# Patient Record
Sex: Male | Born: 1965 | Race: White | Hispanic: No | Marital: Married | Smoking: Never smoker
Health system: Southern US, Community
[De-identification: ages and names within clinical notes are randomized; demographics above are authoritative.]

## PROBLEM LIST (undated history)

## (undated) DIAGNOSIS — T7840XA Allergy, unspecified, initial encounter: Secondary | ICD-10-CM

## (undated) DIAGNOSIS — R000 Tachycardia, unspecified: Secondary | ICD-10-CM

## (undated) DIAGNOSIS — J309 Allergic rhinitis, unspecified: Secondary | ICD-10-CM

## (undated) DIAGNOSIS — M1990 Unspecified osteoarthritis, unspecified site: Secondary | ICD-10-CM

## (undated) DIAGNOSIS — L509 Urticaria, unspecified: Secondary | ICD-10-CM

## (undated) HISTORY — DX: Tachycardia, unspecified: R00.0

## (undated) HISTORY — DX: Urticaria, unspecified: L50.9

## (undated) HISTORY — DX: Allergy, unspecified, initial encounter: T78.40XA

## (undated) HISTORY — PX: NO PAST SURGERIES: SHX2092

## (undated) HISTORY — DX: Allergic rhinitis, unspecified: J30.9

## (undated) HISTORY — DX: Unspecified osteoarthritis, unspecified site: M19.90

---

## 2014-04-08 ENCOUNTER — Emergency Department (HOSPITAL_COMMUNITY): Payer: BC Managed Care – PPO

## 2014-04-08 ENCOUNTER — Emergency Department (HOSPITAL_COMMUNITY)
Admission: EM | Admit: 2014-04-08 | Discharge: 2014-04-08 | Disposition: A | Discharge status: Home / Self Care | Payer: BC Managed Care – PPO | Attending: Emergency Medicine | Admitting: Emergency Medicine

## 2014-04-08 ENCOUNTER — Encounter (HOSPITAL_COMMUNITY): Payer: Self-pay | Admitting: Emergency Medicine

## 2014-04-08 DIAGNOSIS — Z23 Encounter for immunization: Secondary | ICD-10-CM | POA: Insufficient documentation

## 2014-04-08 DIAGNOSIS — Y9389 Activity, other specified: Secondary | ICD-10-CM | POA: Diagnosis not present

## 2014-04-08 DIAGNOSIS — Z880 Allergy status to penicillin: Secondary | ICD-10-CM | POA: Diagnosis not present

## 2014-04-08 DIAGNOSIS — M25512 Pain in left shoulder: Secondary | ICD-10-CM

## 2014-04-08 DIAGNOSIS — IMO0002 Reserved for concepts with insufficient information to code with codable children: Secondary | ICD-10-CM | POA: Insufficient documentation

## 2014-04-08 DIAGNOSIS — S4980XA Other specified injuries of shoulder and upper arm, unspecified arm, initial encounter: Secondary | ICD-10-CM | POA: Diagnosis present

## 2014-04-08 DIAGNOSIS — S46909A Unspecified injury of unspecified muscle, fascia and tendon at shoulder and upper arm level, unspecified arm, initial encounter: Secondary | ICD-10-CM | POA: Diagnosis present

## 2014-04-08 DIAGNOSIS — T148XXA Other injury of unspecified body region, initial encounter: Secondary | ICD-10-CM

## 2014-04-08 DIAGNOSIS — Y92410 Unspecified street and highway as the place of occurrence of the external cause: Secondary | ICD-10-CM | POA: Insufficient documentation

## 2014-04-08 LAB — BASIC METABOLIC PANEL
Anion gap: 14 (ref 5–15)
BUN: 18 mg/dL (ref 6–23)
CALCIUM: 9 mg/dL (ref 8.4–10.5)
CHLORIDE: 104 meq/L (ref 96–112)
CO2: 25 mEq/L (ref 19–32)
CREATININE: 1.19 mg/dL (ref 0.50–1.35)
GFR, EST AFRICAN AMERICAN: 84 mL/min — AB (ref >90)
GFR, EST NON AFRICAN AMERICAN: 72 mL/min — AB (ref >90)
Glucose, Bld: 93 mg/dL (ref 70–99)
Potassium: 3.9 mEq/L (ref 3.7–5.3)
Sodium: 143 mEq/L (ref 137–147)

## 2014-04-08 LAB — CBC WITH DIFFERENTIAL/PLATELET
BASOS ABS: 0 10*3/uL (ref 0.0–0.1)
BASOS PCT: 0 % (ref 0–1)
EOS ABS: 0 10*3/uL (ref 0.0–0.7)
Eosinophils Relative: 0 % (ref 0–5)
HEMATOCRIT: 41.5 % (ref 39.0–52.0)
HEMOGLOBIN: 14.8 g/dL (ref 13.0–17.0)
Lymphocytes Relative: 8 % — ABNORMAL LOW (ref 12–46)
Lymphs Abs: 0.8 10*3/uL (ref 0.7–4.0)
MCH: 30.8 pg (ref 26.0–34.0)
MCHC: 35.7 g/dL (ref 30.0–36.0)
MCV: 86.3 fL (ref 78.0–100.0)
MONO ABS: 0.6 10*3/uL (ref 0.1–1.0)
Monocytes Relative: 6 % (ref 3–12)
Neutro Abs: 8.6 10*3/uL — ABNORMAL HIGH (ref 1.7–7.7)
Neutrophils Relative %: 86 % — ABNORMAL HIGH (ref 43–77)
Platelets: 225 10*3/uL (ref 150–400)
RBC: 4.81 MIL/uL (ref 4.22–5.81)
RDW: 12.7 % (ref 11.5–15.5)
WBC: 10 10*3/uL (ref 4.0–10.5)

## 2014-04-08 MED ORDER — MORPHINE SULFATE 4 MG/ML IJ SOLN
4.0000 mg | Freq: Once | INTRAMUSCULAR | Status: AC
  Administered 2014-04-08: 4 mg via INTRAVENOUS
  Filled 2014-04-08: qty 1

## 2014-04-08 MED ORDER — HYDROCODONE-ACETAMINOPHEN 5-325 MG PO TABS: 1.0000 | ORAL_TABLET | Freq: Four times a day (QID) | ORAL | Status: DC | PRN

## 2014-04-08 MED ORDER — TETANUS-DIPHTH-ACELL PERTUSSIS 5-2.5-18.5 LF-MCG/0.5 IM SUSP
0.5000 mL | Freq: Once | INTRAMUSCULAR | Status: AC
Start: 2014-04-08 — End: 2014-04-08
  Administered 2014-04-08: 0.5 mL via INTRAMUSCULAR
  Filled 2014-04-08: qty 0.5
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
## 2014-04-08 NOTE — Discharge Instructions (Signed)
Abrasion An abrasion is a cut or scrape of the skin. Abrasions do not extend through all layers of the skin and most heal within 10 days. It is important to care for your abrasion properly to prevent infection. CAUSES  Most abrasions are caused by falling on, or gliding across, the ground or other surface. When your skin rubs on something, the outer and inner layer of skin rubs off, causing an abrasion. DIAGNOSIS  Your caregiver will be able to diagnose an abrasion during a physical exam.  TREATMENT  Your treatment depends on how large and deep the abrasion is. Generally, your abrasion will be cleaned with water and a mild soap to remove any dirt or debris. An antibiotic ointment may be put over the abrasion to prevent an infection. A bandage (dressing) may be wrapped around the abrasion to keep it from getting dirty.  You may need a tetanus shot if:  You cannot remember when you had your last tetanus shot.  You have never had a tetanus shot.  The injury broke your skin. If you get a tetanus shot, your arm may swell, get red, and feel warm to the touch. This is common and not a problem. If you need a tetanus shot and you choose not to have one, there is a rare chance of getting tetanus. Sickness from tetanus can be serious.  HOME CARE INSTRUCTIONS   If a dressing was applied, change it at least once a day or as directed by your caregiver. If the bandage sticks, soak it off with warm water.   Wash the area with water and a mild soap to remove all the ointment 2 times a day. Rinse off the soap and pat the area dry with a clean towel.   Reapply any ointment as directed by your caregiver. This will help prevent infection and keep the bandage from sticking. Use gauze over the wound and under the dressing to help keep the bandage from sticking.   Change your dressing right away if it becomes wet or dirty.   Only take over-the-counter or prescription medicines for pain, discomfort, or fever as  directed by your caregiver.   Follow up with your caregiver within 24-48 hours for a wound check, or as directed. If you were not given a wound-check appointment, look closely at your abrasion for redness, swelling, or pus. These are signs of infection. SEEK IMMEDIATE MEDICAL CARE IF:   You have increasing pain in the wound.   You have redness, swelling, or tenderness around the wound.   You have pus coming from the wound.   You have a fever or persistent symptoms for more than 2-3 days.  You have a fever and your symptoms suddenly get worse.  You have a bad smell coming from the wound or dressing.  MAKE SURE YOU:   Understand these instructions.  Will watch your condition.  Will get help right away if you are not doing well or get worse. Document Released: 04/05/2005 Document Revised: 06/12/2012 Document Reviewed: 05/30/2011 [facility] Patient Information 2015 [place], [place]. This information is not intended to replace advice given to you by your health care provider. Make sure you discuss any questions you have with your health care provider. Shoulder Separation You have a shoulder separation (acromioclavicular separation). This occurs when an injury stretches or tears the ligaments that connect the top of the shoulder blade to the collar bone. Injury causes these bones to separate. A sling or splint may be applied to limit  your range of motion. HOME CARE INSTRUCTIONS   Apply ice to the injury for 15-20 minutes each hour while awake for the first 2 days. Put the ice in a plastic bag. Place a towel between the bag of ice and your skin.  Avoid activities that increase pain.  Wear your sling or splint for as long as directed by your caregiver. If you remove the sling to dress or bathe, be sure your arm remains in the same position as when the sling is on. Do not lift your arm.  The splint must be tightened by another person every day. Tighten it enough to keep the shoulders held  back. Allow enough room to place the index finger between the body and strap. Loosen the splint immediately if you feel numbness or tingling in your hands.  Only take over-the-counter or prescription medicines for pain, discomfort, or fever as directed by your caregiver.  If this is a severe injury, you may need a referral to a specialist. It is very important to keep any follow-up appointments in order to avoid any type of permanent shoulder disability or chronic pain problems. SEEK MEDICAL CARE IF:  Pain or swelling to the injury site increases and is not relieved with medications. SEEK IMMEDIATE MEDICAL CARE IF:  Your arm becomes numb, pale, cold, or there are abnormal feelings (sensations) shooting into your fingertips. Document Released: 04/05/2005 Document Revised: 11/10/2013 Document Reviewed: 02/05/2007 [facility] Patient Information 2015 [place], [place]. This information is not intended to replace advice given to you by your health care provider. Make sure you discuss any questions you have with your health care provider.

## 2014-04-08 NOTE — ED Notes (Signed)
Family at bedside. 
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
## 2014-04-13 ENCOUNTER — Encounter: Payer: Self-pay | Admitting: Family Medicine

## 2014-04-13 ENCOUNTER — Ambulatory Visit (INDEPENDENT_AMBULATORY_CARE_PROVIDER_SITE_OTHER): Payer: BC Managed Care – PPO | Admitting: Family Medicine

## 2014-04-13 VITALS — BP 113/74 | HR 77 | Temp 98.6°F | Resp 16 | Ht 68.0 in | Wt 174.0 lb

## 2014-04-13 DIAGNOSIS — L089 Local infection of the skin and subcutaneous tissue, unspecified: Secondary | ICD-10-CM

## 2014-04-13 DIAGNOSIS — S40812A Abrasion of left upper arm, initial encounter: Secondary | ICD-10-CM

## 2014-04-13 MED ORDER — SULFAMETHOXAZOLE-TMP DS 800-160 MG PO TABS: ORAL_TABLET | ORAL | Status: DC

## 2014-04-13 MED ORDER — SILVER SULFADIAZINE 1 % EX CREA: 1.0000 "application " | TOPICAL_CREAM | Freq: Every day | CUTANEOUS | Status: DC
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
## 2014-04-13 NOTE — Progress Notes (Signed)
Pre visit review using our clinic review tool, if applicable. No additional management support is needed unless otherwise documented below in the visit note. 

## 2014-04-19 ENCOUNTER — Encounter: Payer: Self-pay | Admitting: Family Medicine

## 2014-04-20 ENCOUNTER — Encounter: Payer: Self-pay | Admitting: Family Medicine

## 2014-04-20 ENCOUNTER — Ambulatory Visit (INDEPENDENT_AMBULATORY_CARE_PROVIDER_SITE_OTHER): Payer: BC Managed Care – PPO | Admitting: Family Medicine

## 2014-04-20 VITALS — BP 125/74 | HR 78 | Temp 98.3°F | Resp 18 | Ht 68.0 in | Wt 176.0 lb

## 2014-04-20 DIAGNOSIS — Z8042 Family history of malignant neoplasm of prostate: Secondary | ICD-10-CM | POA: Insufficient documentation

## 2014-04-20 DIAGNOSIS — L089 Local infection of the skin and subcutaneous tissue, unspecified: Secondary | ICD-10-CM | POA: Insufficient documentation

## 2014-04-20 DIAGNOSIS — S40812A Abrasion of left upper arm, initial encounter: Secondary | ICD-10-CM

## 2014-04-20 DIAGNOSIS — S40812D Abrasion of left upper arm, subsequent encounter: Secondary | ICD-10-CM

## 2014-04-20 DIAGNOSIS — L509 Urticaria, unspecified: Secondary | ICD-10-CM

## 2014-04-20 NOTE — Progress Notes (Signed)
Pre visit review using our clinic review tool, if applicable. No additional management support is needed unless otherwise documented below in the visit note. 

## 2014-04-20 NOTE — Progress Notes (Signed)
OFFICE NOTE  04/20/2014  CC:  Chief Complaint  Patient presents with  . Follow-up    arm abrasion     HPI: Patient is a 48 y.o. Caucasian male who is here for 1 week f/u left arm infected abrasion. Arm is much improved.  No fever, malaise, body aches.  Tolerating antibiotics fine. Not requiring pain meds.  Pt reports longstanding history of "random itching" on focal areas of his skin---often a hand or leg.  He scratches the itch and it will swell, although he denies seeing any red rash or hives in the area.  He does not take any meds when it happens, says he never gets any SOB/wheezing, swelling of lips, tongue, or eyes. He most recently had the itching on left side of chin and this was the first time he has had it on his face: his wife showed me a pic of this on her cell phone today.  His skin/soft tissue of left lower jaw region appears swollen, but I see no erythema, rash, or swelling of the lip or eyes.  Pt cannot detect any trigger to these itching/swelling episodes.  Pertinent PMH:  Past medical, surgical, social, and family history reviewed and no changes are noted since last office visit.  MEDS: Not taking vicodin listed below Outpatient Prescriptions Prior to Visit  Medication Sig Dispense Refill  . ibuprofen (ADVIL,MOTRIN) 200 MG tablet Take 200 mg by mouth every 6 (six) hours as needed.      . silver sulfADIAZINE (SILVADENE) 1 % cream Apply 1 application topically daily.  50 g  1  . sulfamethoxazole-trimethoprim (BACTRIM DS) 800-160 MG per tablet 2 tabs po bid x 3d, then 1 tab po bid x 7d  26 tablet  0  . HYDROcodone-acetaminophen (NORCO/VICODIN) 5-325 MG per tablet Take 1 tablet by mouth every 6 (six) hours as needed.  12 tablet  0   No facility-administered medications prior to visit.    PE: Blood pressure 125/74, pulse 78, temperature 98.3 F (36.8 C), temperature source Temporal, resp. rate 18, height 5\' 8"  (1.727 m), weight 176 lb (79.833 kg), SpO2 97.00%. Gen:  Alert, well appearing.  Patient is oriented to person, place, time, and situation. Face: no swelling.   Left upper arm with large patch of pink skin that is dry and without any weeping areas or erythema/exudate. No tenderness.  No streaking or surrounding erythema or induration.  IMPRESSION AND PLAN:  1) Left upper arm abrasion, healing appropriately with bactrim and silvadene. May d/c silvadene. Finish bactrim.  2) Focal skin itching/swelling, unknown cause. Refer to allergist for further evaluation.  An After Visit Summary was printed and given to the patient.  FOLLOW UP: prn

## 2014-04-23 ENCOUNTER — Encounter: Payer: Self-pay | Admitting: Family Medicine

## 2014-05-14 ENCOUNTER — Encounter: Payer: Self-pay | Admitting: Family Medicine
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
## 2016-05-25 ENCOUNTER — Encounter: Payer: Self-pay | Admitting: Physician Assistant

## 2016-05-25 ENCOUNTER — Ambulatory Visit (INDEPENDENT_AMBULATORY_CARE_PROVIDER_SITE_OTHER): Payer: BLUE CROSS/BLUE SHIELD | Admitting: Physician Assistant

## 2016-05-25 VITALS — BP 126/80 | HR 78 | Temp 97.3°F | Resp 16 | Ht 68.0 in | Wt 182.4 lb

## 2016-05-25 DIAGNOSIS — Z13220 Encounter for screening for lipoid disorders: Secondary | ICD-10-CM

## 2016-05-25 DIAGNOSIS — Z131 Encounter for screening for diabetes mellitus: Secondary | ICD-10-CM

## 2016-05-25 DIAGNOSIS — G8929 Other chronic pain: Secondary | ICD-10-CM

## 2016-05-25 DIAGNOSIS — Z0000 Encounter for general adult medical examination without abnormal findings: Secondary | ICD-10-CM | POA: Diagnosis not present

## 2016-05-25 DIAGNOSIS — E559 Vitamin D deficiency, unspecified: Secondary | ICD-10-CM

## 2016-05-25 DIAGNOSIS — Z130 Encounter for screening for diseases of the blood and blood-forming organs and certain disorders involving the immune mechanism: Secondary | ICD-10-CM

## 2016-05-25 DIAGNOSIS — N401 Enlarged prostate with lower urinary tract symptoms: Secondary | ICD-10-CM

## 2016-05-25 DIAGNOSIS — Z1389 Encounter for screening for other disorder: Secondary | ICD-10-CM

## 2016-05-25 DIAGNOSIS — Z79899 Other long term (current) drug therapy: Secondary | ICD-10-CM

## 2016-05-25 DIAGNOSIS — R350 Frequency of micturition: Secondary | ICD-10-CM

## 2016-05-25 DIAGNOSIS — Z125 Encounter for screening for malignant neoplasm of prostate: Secondary | ICD-10-CM

## 2016-05-25 DIAGNOSIS — M25562 Pain in left knee: Principal | ICD-10-CM

## 2016-05-25 LAB — LIPID PANEL
CHOL/HDL RATIO: 3.2 ratio (ref <5.0)
CHOLESTEROL: 281 mg/dL — AB (ref <200)
HDL: 89 mg/dL (ref >40)
LDL Cholesterol: 178 mg/dL — ABNORMAL HIGH (ref <100)
Triglycerides: 68 mg/dL (ref <150)
VLDL: 14 mg/dL (ref <30)

## 2016-05-25 LAB — BASIC METABOLIC PANEL WITH GFR
BUN: 19 mg/dL (ref 7–25)
CO2: 22 mmol/L (ref 20–31)
Calcium: 9.8 mg/dL (ref 8.6–10.3)
Chloride: 105 mmol/L (ref 98–110)
Creat: 1.1 mg/dL (ref 0.60–1.35)
GFR, Est Non African American: 79 mL/min (ref >=60)
Glucose, Bld: 92 mg/dL (ref 65–99)
Potassium: 4.4 mmol/L (ref 3.5–5.3)
Sodium: 138 mmol/L (ref 135–146)

## 2016-05-25 LAB — CBC WITH DIFFERENTIAL/PLATELET
Basophils Absolute: 0 cells/uL (ref 0–200)
Basophils Relative: 0 %
EOS ABS: 50 {cells}/uL (ref 15–500)
Eosinophils Relative: 1 %
HCT: 44.8 % (ref 38.5–50.0)
Hemoglobin: 15.7 g/dL (ref 13.2–17.1)
LYMPHS PCT: 21 %
Lymphs Abs: 1050 cells/uL (ref 850–3900)
MCH: 30.4 pg (ref 27.0–33.0)
MCHC: 35 g/dL (ref 32.0–36.0)
MCV: 86.7 fL (ref 80.0–100.0)
MONOS PCT: 9 %
MPV: 9.1 fL (ref 7.5–12.5)
Monocytes Absolute: 450 cells/uL (ref 200–950)
NEUTROS PCT: 69 %
Neutro Abs: 3450 cells/uL (ref 1500–7800)
PLATELETS: 261 10*3/uL (ref 140–400)
RBC: 5.17 MIL/uL (ref 4.20–5.80)
RDW: 13.9 % (ref 11.0–15.0)
WBC: 5 10*3/uL (ref 3.8–10.8)

## 2016-05-25 LAB — HEPATIC FUNCTION PANEL
ALT: 12 U/L (ref 9–46)
AST: 15 U/L (ref 10–40)
Albumin: 4.6 g/dL (ref 3.6–5.1)
Alkaline Phosphatase: 62 U/L (ref 40–115)
BILIRUBIN INDIRECT: 0.7 mg/dL (ref 0.2–1.2)
BILIRUBIN TOTAL: 0.8 mg/dL (ref 0.2–1.2)
Bilirubin, Direct: 0.1 mg/dL (ref <=0.2)
Total Protein: 6.9 g/dL (ref 6.1–8.1)

## 2016-05-25 LAB — TSH: TSH: 2.19 mIU/L (ref 0.40–4.50)

## 2016-05-25 MED ORDER — MELOXICAM 15 MG PO TABS: ORAL_TABLET | ORAL | 1 refills | Status: DC

## 2016-05-25 NOTE — Progress Notes (Signed)
New patient CPE  Assessment and Plan: 1. Chronic pain of left knee Likely patellar tendonitis mobic x 1 month Ice, do not put pressure on it If not better refer ortho/MRI  2. Benign prostatic hyperplasia with urinary frequency - PSA  3. Screening cholesterol level - Lipid panel  4. Screening for blood or protein in urine - Urinalysis, Routine w reflex microscopic (not at [facility]) - Microalbumin / creatinine urine ratio  5. Screening, anemia, deficiency, iron - Iron and TIBC - Ferritin  6. Screening for diabetes mellitus - Hemoglobin A1c  7. Vitamin D deficiency - VITAMIN D 25 Hydroxy (Vit-D Deficiency, Fractures)  8. Medication management - CBC with Differential/Platelet - BASIC METABOLIC PANEL WITH GFR - Hepatic function panel - Magnesium  Discussed med's effects and SE's. Screening labs and tests as requested with regular follow-up as recommended.  HPI Patient presents for a complete physical. Moved here from [place], no PCP.   His blood pressure has been controlled at home, today their BP is BP: 126/80 Last GFR: Lab Results  Component Value Date   GFRNONAA 72 (L) 04/08/2014   Right knee pain, worse with leaning on something or bending down x 6 months. No pain with walking, standing, has some pain with lateral turning. Works for [employer] and is on his knees a lot and has been having issues with it then. No radiation, no swelling, redness, warmth. No tylenol/aleve.   Current Medications:  No current outpatient prescriptions on file prior to visit.   No current facility-administered medications on file prior to visit.    Allergies:  Allergies  Allergen Reactions  . Aspirin Rash  . Penicillins Swelling   Patient Care Team: Unk Pinto, MD as PCP - General (Internal Medicine)  Immunization History  Administered Date(s) Administered  . Tdap 04/08/2014    Medical History:  has Family history of prostate cancer and Abrasion of upper arm, left, infected on  his problem list. Surgical History:  He  has no past surgical history on file. Family History:  His family history includes Arthritis in his mother; Asthma in his brother; Prostate cancer (age of onset: 41) in his father. Social History:   reports that he has never smoked. He has never used smokeless tobacco. He reports that he drinks alcohol. He reports that he does not use drugs.  Review of Systems:  Review of Systems  Constitutional: Negative.   HENT: Negative.   Eyes: Negative.   Respiratory: Negative.   Cardiovascular: Negative.   Gastrointestinal: Negative.   Genitourinary: Negative.        Nocturia x 2-3  Musculoskeletal: Positive for joint pain.  Skin: Negative.   Neurological: Negative.   Endo/Heme/Allergies: Negative.   Psychiatric/Behavioral: Negative.     Physical Exam: Estimated body mass index is 27.73 kg/m as calculated from the following:   Height as of this encounter: 5\' 8"  (1.727 m).   Weight as of this encounter: 182 lb 6.4 oz (82.7 kg). BP 126/80   Pulse 78   Temp 97.3 F (36.3 C)   Resp 16   Ht 5\' 8"  (1.727 m)   Wt 182 lb 6.4 oz (82.7 kg)   SpO2 98%   BMI 27.73 kg/m  General Appearance: Well nourished, in no apparent distress.  Eyes: PERRLA, EOMs, conjunctiva no swelling or erythema, normal fundi and vessels.  Sinuses: No Frontal/maxillary tenderness  ENT/Mouth: Ext aud canals clear, normal light reflex with TMs without erythema, bulging. Good dentition. No erythema, swelling, or exudate on post pharynx.  Tonsils not swollen or erythematous. Hearing normal.  Neck: Supple, thyroid normal. No bruits  Respiratory: Respiratory effort normal, BS equal bilaterally without rales, rhonchi, wheezing or stridor.  Cardio: RRR without murmurs, rubs or gallops. Brisk peripheral pulses without edema.  Chest: symmetric, with normal excursions and percussion.  Abdomen: Soft, nontender, no guarding, rebound, hernias, masses, or organomegaly.  Lymphatics: Non tender  without lymphadenopathy.  GYN: defer Dr. Melford Aase Musculoskeletal: Full ROM all peripheral extremities,5/5 strength, and normal gait. Right knee without swelling, warmth, erythema or effusion. No joint line tenderness, no bursa swelling, no laxity of ACL, MCL/LCL. + tenderness at patellar tendon, no bony tenderensss.  Skin: Warm, dry without rashes, lesions, ecchymosis. Dermafibroma left lateral thigh.  Neuro: Cranial nerves intact, reflexes equal bilaterally. Normal muscle tone, no cerebellar symptoms. Sensation intact.  Psych: Awake and oriented X 3, normal affect, Insight and Judgment appropriate.   Vicie Mutters 11:56 AM [facility]

## 2016-05-25 NOTE — Patient Instructions (Addendum)
Mobic once daily x 1 month Ice at least once daily for 15 mins Avoid pressure If not better will refer ortho/get MRI   Hoffa Disease Hoffa disease is a condition that causes pain in front of the knee, below the kneecap. The pain is caused by pinching (impingement) ofa swollen or scarred mass of fatty tissue (fat pad) below your kneecap. There are two kinds of Hoffa disease:  Acute Hoffa disease. This kind develops due to a recent injury.  Chronic Hoffa disease. This kind develops over time. Hoffa disease is also called infrapatellar fat pad impingement. What are the causes? Acute Hoffa disease is caused by an injury to the fat pad below your kneecap. It can happen if:  You are hit under your kneecap.  You fall on your knee.  You forcefully straighten your knee more than normal (hyperextend your knee). Chronic Hoffa disease can be caused by repeated minor injuries, such as:  Repeatedly kneeling or falling on a hard surface.  Repeatedly hyperextending your knee. What increases the risk? This condition is more likely to develop in people who play:  Contact sports, such as hockey and football.  Sports that involve forceful extending of the knee, such as football and soccer. What are the signs or symptoms? The main symptom of this condition is pain in the front of your knee. The pain may:  Be burning or aching.  Get worse when you straighten your knee, squat, or stand for long periods of time.  Be felt when you touch or move the kneecap (patella). There may also be swelling below the patella. How is this diagnosed? This condition may be diagnosed with:  Your medical history.  A physical exam. The exam may include straightening your knee with pressure near the fat pad and moving your knee in different directions.  MRI. This may be done to look for swelling or changes to the fat pad. How is this treated? This condition may be treated by:  Resting your knee until swelling  and pain improve.  Icing your knee.  Taking over-the-counter pain medicine to reduce pain and swelling.  Wrapping or taping your knee.  Injecting the fat pad with numbing and anti-inflammatory medicine (steroid).  Doing knee exercises.  Avoiding putting direct pressure on your knees, such as by kneeling.  Staying aware of the way you walk and stand so your knee does not hyperextend. If these treatments are not helpful, you may need to have surgery. Surgery usually includes taking out the part of the fat pad that is causing pain. Follow these instructions at home: If your knee is wrapped or taped:  Wear the wrap or tape as told by your health care provider.  Loosen the wrap or tape if your toes tingle, become numb, or turn cold and blue.  Remove the wrap or tape only as told by your health care provider. Managing pain, stiffness, and swelling  If directed, apply ice to your knee.  Put ice in a plastic bag.  Place a towel between your skin and the bag.  Leave the ice on for 20 minutes, 2-3 times a day.  Move your toes often to avoid stiffness and to lessen swelling.  Raise (elevate) your knee above the level of your heart while you are sitting or lying down. Activity  Return to your normal activities as told by your health care provider. Ask your health care provider what activities are safe for you.  Avoiding putting direct pressure on your knees, such  as by kneeling, for as long as told by your health care provider.  Do exercises as told by your health care provider. General instructions  Take over-the-counter and prescription medicines only as told by your health care provider.  Keep all follow-up visits as told by your health care provider. This is important. How is this prevented?  Warm up and stretch before being active.  Cool down and stretch after being active.  Give your body time to rest between periods of activity.  Make sure to use equipment that  fits you.  When playing contact sports, wear pads that protect your knees.  Be safe and responsible while being active to avoid falls.  Do at least 150 minutes of moderate-intensity exercise each week, such as brisk walking or water aerobics.  Maintain physical fitness, including:  Strength.  Flexibility.  Cardiovascular fitness.  Endurance. Contact a health care provider if:  Your knee pain or swelling is not improving after 2 weeks of home treatment.  Your knee pain or swelling is getting worse. This information is not intended to replace advice given to you by your health care provider. Make sure you discuss any questions you have with your health care provider. Document Released: 06/26/2005 Document Revised: 03/02/2016 Document Reviewed: 05/08/2015 Elsevier Interactive Patient Education  2017 [employer]   Patellar Tendinitis Rehab Ask your health care provider which exercises are safe for you. Do exercises exactly as told by your health care provider and adjust them as directed. It is normal to feel mild stretching, pulling, tightness, or discomfort as you do these exercises, but you should stop right away if you feel sudden pain or your pain gets worse.Do not begin these exercises until told by your health care provider. Stretching and range of motion exercises This exercise warms up your muscles and joints and improves the movement and flexibility of your knee. This exercise also helps to relieve pain and stiffness. Exercise A: Hamstring, doorway 1. Lie on your back in front of a doorway with your __________ leg resting against the wall and your other leg flat on the floor in the doorway. There should be a slight bend in your __________ knee. 2. Straighten your __________ knee. You should feel a stretch behind your knee or thigh. If you do not, scoot your buttocks closer to the door. 3. Hold this position for __________ seconds. Repeat __________ times. Complete this  stretch __________ times a day. Strengthening exercises These exercises build strength and endurance in your knee. Endurance is the ability to use your muscles for a long time, even after they get tired. Exercise B: Quadriceps, isometric 1. Lie on your back with your __________ leg extended and your other knee bent. 2. Slowly tense the muscles in the front of your __________ thigh. When you do this, you should see your kneecap slide up toward your hip or see increased dimpling just above the knee. This motion will push the back of your knee toward the floor. If this is painful, try putting a rolled-up hand towel under your knee to support it in a bent position. Change the size of the towel to find a position that allows you to do this exercise without any pain. 3. For __________ seconds, hold the muscle as tight as you can without increasing your pain. 4. Relax the muscles slowly and completely. Repeat __________ times. Complete this exercise __________ times a day. Exercise C: Straight leg raises (quadriceps) 1. Lie on your back with your __________ leg extended  and your other knee bent. 2. Tense the muscles in the front of your __________ thigh. When you do this, you should see your kneecap slide up or see increased dimpling just above the knee. 3. Keep these muscles tight as you raise your leg 4-6 inches (10-15 cm) off the floor. Do not let your moving knee bend. 4. Hold this position for __________ seconds. 5. Keep these muscles tense as you slowly lower your leg. 6. Relax your muscles slowly and completely. Repeat __________ times. Complete this exercise __________ times a day. Exercise D: Squats 1. Stand in front of a table, with your feet and knees pointing straight ahead. You may rest your hands on the table for balance but not for support. 2. Slowly bend your knees and lower your hips like you are going to sit in a chair.  Keep your weight over your heels, not over your toes.  Keep  your lower legs upright so they are parallel with the table legs.  Do not let your hips go lower than your knees.  Do not bend lower than told by your health care provider.  If your knee pain increases, do not bend as low. 3. Hold the squat position for __________ seconds. 4. Slowly push with your legs to return to standing. Do not use your hands to pull yourself to standing. Repeat __________ times. Complete this exercise __________ times a day. Exercise E: Step-downs 1. Stand on the edge of a step. 2. Keeping your weight over your __________ heel, slowly bend your __________ knee to bring your __________ heel toward the floor. Lower your heel as far as you can while keeping control and without increasing any discomfort.  Do not let your __________ knee come forward.  Use your leg muscles, not gravity, to lower your body.  Hold a wall or rail for balance if needed. 3. Slowly push through your heel to lift your body weight back up. 4. Return to the starting position. Repeat __________ times. Complete this exercise __________ times a day. Exercise F: Straight leg raises (hip abductors) 1. Lie on your side with your __________ leg in the top position. Lie so your head, shoulder, knee, and hip line up. You may bend your lower knee to help you keep your balance. 2. Roll your hips slightly forward, so that your hips are stacked directly over each other and your __________ knee is facing forward. 3. Leading with your heel, lift your top leg 4-6 inches (10-15 cm). You should feel the muscles in your outer hip lifting.  Do not let your foot drift forward.  Do not let your knee roll toward the ceiling. 4. Hold this position for __________ seconds. 5. Slowly lower your leg to the starting position. 6. Let your muscles relax completely after each repetition. Repeat __________ times. Complete this exercise __________ times a day. This information is not intended to replace advice given to you  by your health care provider. Make sure you discuss any questions you have with your health care provider. Document Released: 06/26/2005 Document Revised: 03/02/2016 Document Reviewed: 03/30/2015 Elsevier Interactive Patient Education  2017 [employer].

## 2016-05-26 LAB — URINALYSIS, ROUTINE W REFLEX MICROSCOPIC
Bilirubin Urine: NEGATIVE
Glucose, UA: NEGATIVE
Hgb urine dipstick: NEGATIVE
KETONES UR: NEGATIVE
LEUKOCYTES UA: NEGATIVE
NITRITE: NEGATIVE
PH: 5.5 (ref 5.0–8.0)
Protein, ur: NEGATIVE
SPECIFIC GRAVITY, URINE: 1.025 (ref 1.001–1.035)

## 2016-05-26 LAB — HEMOGLOBIN A1C
Hgb A1c MFr Bld: 4.9 % (ref <5.7)
MEAN PLASMA GLUCOSE: 94 mg/dL

## 2016-05-26 LAB — IRON AND TIBC
%SAT: 40 % (ref 15–60)
Iron: 136 ug/dL (ref 50–180)
TIBC: 342 ug/dL (ref 250–425)
UIBC: 206 ug/dL (ref 125–400)

## 2016-05-26 LAB — MICROALBUMIN / CREATININE URINE RATIO
Creatinine, Urine: 203 mg/dL (ref 20–370)
MICROALB/CREAT RATIO: 3 ug/mg{creat} (ref <30)
Microalb, Ur: 0.6 mg/dL

## 2016-05-26 LAB — FERRITIN: Ferritin: 193 ng/mL (ref 20–380)

## 2016-05-26 LAB — MAGNESIUM: Magnesium: 2.1 mg/dL (ref 1.5–2.5)

## 2016-05-26 LAB — VITAMIN D 25 HYDROXY (VIT D DEFICIENCY, FRACTURES): Vit D, 25-Hydroxy: 36 ng/mL (ref 30–100)

## 2016-05-26 LAB — PSA: PSA: 1.8 ng/mL (ref <=4.0)

## 2016-06-05 ENCOUNTER — Ambulatory Visit: Payer: BLUE CROSS/BLUE SHIELD | Admitting: Internal Medicine

## 2016-06-05 ENCOUNTER — Encounter: Payer: Self-pay | Admitting: Internal Medicine

## 2016-06-05 VITALS — BP 126/74 | HR 76 | Temp 97.7°F | Resp 16 | Ht 68.0 in | Wt 184.0 lb

## 2016-06-05 DIAGNOSIS — R351 Nocturia: Secondary | ICD-10-CM

## 2016-06-05 NOTE — Progress Notes (Signed)
  Patient was seen recently for CPE and was referred back in today for consideration of DRE with c/o occasional Nocturia 1 to 2 x and patient denied any LUTS's otherwise and was somewhat hesitant to have DRE today.   He did have a recent Nl / negative U/A and PSA. He related that he is scheduled for a CPE in August 2018 and deferred to have his prostate checked at that visit. Advised to return sooner if develops sx's of prostatism.  ( N/C for visit today)

## 2016-06-08 ENCOUNTER — Encounter: Payer: Self-pay | Admitting: Internal Medicine

## 2016-11-01 ENCOUNTER — Other Ambulatory Visit: Payer: Managed Care, Other (non HMO)

## 2016-11-01 DIAGNOSIS — E785 Hyperlipidemia, unspecified: Secondary | ICD-10-CM

## 2016-11-01 LAB — LIPID PANEL
Cholesterol: 217 mg/dL — ABNORMAL HIGH (ref <200)
HDL: 77 mg/dL (ref >40)
LDL CALC: 129 mg/dL — AB (ref <100)
Total CHOL/HDL Ratio: 2.8 Ratio (ref <5.0)
Triglycerides: 54 mg/dL (ref <150)
VLDL: 11 mg/dL (ref <30)

## 2016-11-02 NOTE — Progress Notes (Signed)
LVM for pt to return office call for LAB results.

## 2016-11-09 NOTE — Progress Notes (Signed)
LVM for pt to return office call for LAB results.

## 2017-02-27 ENCOUNTER — Encounter: Payer: Self-pay | Admitting: Internal Medicine

## 2017-03-15 ENCOUNTER — Encounter: Payer: Self-pay | Admitting: Internal Medicine

## 2017-03-22 ENCOUNTER — Ambulatory Visit (INDEPENDENT_AMBULATORY_CARE_PROVIDER_SITE_OTHER): Payer: Managed Care, Other (non HMO) | Admitting: Internal Medicine

## 2017-03-22 ENCOUNTER — Encounter: Payer: Self-pay | Admitting: Internal Medicine

## 2017-03-22 VITALS — BP 138/78 | HR 79 | Temp 97.3°F | Ht 68.0 in | Wt 186.0 lb

## 2017-03-22 DIAGNOSIS — Z125 Encounter for screening for malignant neoplasm of prostate: Secondary | ICD-10-CM | POA: Diagnosis not present

## 2017-03-22 DIAGNOSIS — E559 Vitamin D deficiency, unspecified: Secondary | ICD-10-CM | POA: Diagnosis not present

## 2017-03-22 DIAGNOSIS — E782 Mixed hyperlipidemia: Secondary | ICD-10-CM

## 2017-03-22 DIAGNOSIS — Z79899 Other long term (current) drug therapy: Secondary | ICD-10-CM | POA: Diagnosis not present

## 2017-03-22 DIAGNOSIS — R7309 Other abnormal glucose: Secondary | ICD-10-CM

## 2017-03-22 DIAGNOSIS — Z136 Encounter for screening for cardiovascular disorders: Secondary | ICD-10-CM

## 2017-03-22 DIAGNOSIS — Z0000 Encounter for general adult medical examination without abnormal findings: Secondary | ICD-10-CM

## 2017-03-22 DIAGNOSIS — Z1212 Encounter for screening for malignant neoplasm of rectum: Secondary | ICD-10-CM

## 2017-03-22 DIAGNOSIS — R5383 Other fatigue: Secondary | ICD-10-CM

## 2017-03-22 DIAGNOSIS — R030 Elevated blood-pressure reading, without diagnosis of hypertension: Secondary | ICD-10-CM

## 2017-03-22 DIAGNOSIS — Z1211 Encounter for screening for malignant neoplasm of colon: Secondary | ICD-10-CM

## 2017-03-22 DIAGNOSIS — Z0001 Encounter for general adult medical examination with abnormal findings: Secondary | ICD-10-CM

## 2017-03-22 NOTE — Patient Instructions (Signed)

## 2017-03-22 NOTE — Progress Notes (Signed)
[facility]   Unk Pinto, M.D.      Uvaldo Bristle. Silverio Lay, P.A.-C [facility]                [place], [place] [address] Telephone [phone] Telefax [phone] Annual  Screening/Preventative Visit  & Comprehensive Evaluation & Examination     This very nice 51 y.o. MWM presents for a Screening/Preventative Visit & comprehensive evaluation and management of multiple medical co-morbidities to r/o  HTN, Prediabetes, Hyperlipidemia and Vitamin D Deficiency. He also endorses occasional LBP w/o sciatica and denies hx/o discrete injury. He does relate sx's of pruritis ani but is uncertain if he has hemorrhoids. He also mentions sx's of his dominant LUE occasionally going "to sleep".      Patient does have hx/o several borderline elevated high normal bps felt warrented to monitor closely.   Today's BP is borderline at 138/78. Patient denies any cardiac symptoms as chest pain, palpitations, shortness of breath, dizziness or ankle swelling.     Patient does have hx/o elevated lipids and is pursuing dietary control.  Last lipids were not at goal: Lab Results  Component Value Date   CHOL 217 (H) 11/01/2016   HDL 77 11/01/2016   LDLCALC 129 (H) 11/01/2016   TRIG 54 11/01/2016   CHOLHDL 2.8 11/01/2016      Patient is monitored expectantly for  Prediabetes and he denies reactive hypoglycemic symptoms, visual blurring, diabetic polys or paresthesias. Last A1c was at goal.  Lab Results  Component Value Date   HGBA1C 4.9 05/25/2016       Finally, patient has history of Vitamin D Deficiency with last vitamin D was very low and he does not supplement: Lab Results  Component Value Date   VD25OH 36 05/25/2016   Allergies  Allergen Reactions  . Aspirin Rash  . Penicillins Swelling   Past Medical History:  Diagnosis Date  . Allergic rhinitis   . Arthritis   . Urticaria    "itchy skin that swells  when I scratch it" x years, random--referred to allergist 04/2014 and pt no-showed.   Health Maintenance  Topic Date Due  . HIV Screening  05/17/1983  . INFLUENZA VACCINE  02/07/2017  . TETANUS/TDAP  04/08/2024   Immunization History  Administered Date(s) Administered  . Tdap 04/08/2014   No past surgical history on file. Family History  Problem Relation Age of Onset  . Arthritis Mother   . Prostate cancer Father 74       prostate cancer  . Asthma Brother    Social History   Social History  . Marital status: Married    Spouse name: N/A  . Number of children: N/A  . Years of education: N/A   Occupational History  . Not on file.   Social History Main Topics  . Smoking status: Never Smoker  . Smokeless tobacco: Never Used  . Alcohol use Yes     Comment: very rare, few times a month  . Drug use: No  . Sexual activity: Yes   Other Topics Concern  . Not on file   Social History Narrative   Married, 2 teenage children.   Relocated from [place].    ROS Constitutional: Denies fever, chills, weight loss/gain, headaches, insomnia,  night sweats or change in appetite. Does c/o fatigue. Eyes: Denies redness, blurred vision, diplopia, discharge, itchy or watery  eyes.  ENT: Denies discharge, congestion, post nasal drip, epistaxis, sore throat, earache, hearing loss, dental pain, Tinnitus, Vertigo, Sinus pain or snoring.  Cardio: Denies chest pain, palpitations, irregular heartbeat, syncope, dyspnea, diaphoresis, orthopnea, PND, claudication or edema Respiratory: denies cough, dyspnea, DOE, pleurisy, hoarseness, laryngitis or wheezing.  Gastrointestinal: Denies dysphagia, heartburn, reflux, water brash, pain, cramps, nausea, vomiting, bloating, diarrhea, constipation, hematemesis, melena, hematochezia, jaundice or hemorrhoids Genitourinary: Denies dysuria, frequency, urgency, nocturia, hesitancy, discharge, hematuria or flank pain Musculoskeletal: Denies arthralgia,  myalgia, stiffness, Jt. Swelling, pain, limp or strain/sprain. Denies Falls. Skin: Denies puritis, rash, hives, warts, acne, eczema or change in skin lesion Neuro: No weakness, tremor, incoordination, spasms, paresthesia or pain Psychiatric: Denies confusion, memory loss or sensory loss. Denies Depression. Endocrine: Denies change in weight, skin, hair change, nocturia, and paresthesia, diabetic polys, visual blurring or hyper / hypo glycemic episodes.  Heme/Lymph: No excessive bleeding, bruising or enlarged lymph nodes.  Physical Exam  BP 138/78   Pulse 79   Temp (!) 97.3 F (36.3 C)   Ht 5\' 8"  (1.727 m)   Wt 186 lb (84.4 kg)   SpO2 97%   BMI 28.28 kg/m   General Appearance: Well nourished and well groomed and in no apparent distress.  Eyes: PERRLA, EOMs, conjunctiva no swelling or erythema, normal fundi and vessels. Sinuses: No frontal/maxillary tenderness ENT/Mouth: EACs patent / TMs  nl. Nares clear without erythema, swelling, mucoid exudates. Oral hygiene is good. No erythema, swelling, or exudate. Tongue normal, non-obstructing. Tonsils not swollen or erythematous. Hearing normal.  Neck: Supple, thyroid normal. No bruits, nodes or JVD. Respiratory: Respiratory effort normal.  BS equal and clear bilateral without rales, rhonci, wheezing or stridor. Cardio: Heart sounds are normal with regular rate and rhythm and no murmurs, rubs or gallops. Peripheral pulses are normal and equal bilaterally without edema. No aortic or femoral bruits. Chest: symmetric with normal excursions and percussion.  Abdomen: Soft, with Nl bowel sounds. Nontender, no guarding, rebound, hernias, masses, or organomegaly.  Lymphatics: Non tender without lymphadenopathy.  Genitourinary: No hernias.Testes nl. No ext or int hemorrhoids evident and no noted fissures.  DRE - prostate nl for age - smooth & firm w/o nodules.Stool hemocult - negative Musculoskeletal: Full ROM all peripheral extremities, joint  stability, 5/5 strength, and normal gait. Skin: Warm and dry without rashes, lesions, cyanosis, clubbing or  ecchymosis.  Neuro: Cranial nerves intact, reflexes equal bilaterally. Normal muscle tone, no cerebellar symptoms. Sensation intact.  Pysch: Alert and oriented X 3 with normal affect, insight and judgment appropriate.   Assessment and Plan  1. Annual Preventative/Screening Exam   2. Elevated BP without diagnosis of hypertension  - EKG 12-Lead - Urinalysis, Routine w reflex microscopic - Microalbumin / creatinine urine ratio - CBC with Differential/Platelet - BASIC METABOLIC PANEL WITH GFR - Magnesium - TSH  3. Hyperlipidemia, mixed  - EKG 12-Lead - Hepatic function panel - Lipid panel - TSH  4. Abnormal glucose  - Hemoglobin A1c - Insulin, fasting  5. Vitamin D deficiency  - VITAMIN D 25 Hydroxy  6. Screening for colorectal cancer  - POC Hemoccult Bld/Stl (  7. Prostate cancer screening  - PSA  8. Screening for ischemic heart disease  - EKG 12-Lead - Lipid panel  9. Fatigue  - Iron,Total/Total Iron Binding Cap - Vitamin B12 - Testosterone - CBC with Differential/Platelet - TSH  10. Medication management  - Urinalysis, Routine w reflex microscopic - Microalbumin / creatinine urine ratio - CBC with Differential/Platelet - BASIC  METABOLIC PANEL WITH GFR - Hepatic function panel - Magnesium - Lipid panel - TSH - Hemoglobin A1c - Insulin, fasting - VITAMIN D 25 Hydroxy        Patient was counseled in prudent diet, weight control to achieve/maintain BMI less than 25, BP monitoring, regular exercise and medications as discussed.  Discussed med effects and SE's. Routine screening labs and tests as requested with regular follow-up as recommended. Over 40 minutes of exam, counseling, chart review and high complex critical decision making was performed

## 2017-03-23 LAB — CBC WITH DIFFERENTIAL/PLATELET
Basophils Absolute: 38 cells/uL (ref 0–200)
Basophils Relative: 0.7 %
EOS ABS: 108 {cells}/uL (ref 15–500)
Eosinophils Relative: 2 %
HCT: 39.7 % (ref 38.5–50.0)
HEMOGLOBIN: 13.7 g/dL (ref 13.2–17.1)
Lymphs Abs: 1042 cells/uL (ref 850–3900)
MCH: 29.8 pg (ref 27.0–33.0)
MCHC: 34.5 g/dL (ref 32.0–36.0)
MCV: 86.5 fL (ref 80.0–100.0)
MONOS PCT: 9.7 %
MPV: 10 fL (ref 7.5–12.5)
NEUTROS ABS: 3688 {cells}/uL (ref 1500–7800)
Neutrophils Relative %: 68.3 %
Platelets: 276 10*3/uL (ref 140–400)
RBC: 4.59 10*6/uL (ref 4.20–5.80)
RDW: 12.4 % (ref 11.0–15.0)
Total Lymphocyte: 19.3 %
WBC mixed population: 524 cells/uL (ref 200–950)
WBC: 5.4 10*3/uL (ref 3.8–10.8)

## 2017-03-23 LAB — LIPID PANEL
CHOL/HDL RATIO: 3.1 (calc) (ref <5.0)
CHOLESTEROL: 206 mg/dL — AB (ref <200)
HDL: 67 mg/dL (ref >40)
LDL CHOLESTEROL (CALC): 114 mg/dL — AB
Non-HDL Cholesterol (Calc): 139 mg/dL (calc) — ABNORMAL HIGH (ref <130)
Triglycerides: 132 mg/dL (ref <150)

## 2017-03-23 LAB — HEPATIC FUNCTION PANEL
AG RATIO: 2 (calc) (ref 1.0–2.5)
ALT: 14 U/L (ref 9–46)
AST: 14 U/L (ref 10–40)
Albumin: 4.2 g/dL (ref 3.6–5.1)
Alkaline phosphatase (APISO): 63 U/L (ref 40–115)
Bilirubin, Direct: 0.1 mg/dL (ref 0.0–0.2)
GLOBULIN: 2.1 g/dL (ref 1.9–3.7)
Indirect Bilirubin: 0.3 mg/dL (calc) (ref 0.2–1.2)
TOTAL PROTEIN: 6.3 g/dL (ref 6.1–8.1)
Total Bilirubin: 0.4 mg/dL (ref 0.2–1.2)

## 2017-03-23 LAB — URINALYSIS, ROUTINE W REFLEX MICROSCOPIC
BILIRUBIN URINE: NEGATIVE
Glucose, UA: NEGATIVE
Hgb urine dipstick: NEGATIVE
Ketones, ur: NEGATIVE
Leukocytes, UA: NEGATIVE
Nitrite: NEGATIVE
Protein, ur: NEGATIVE
SPECIFIC GRAVITY, URINE: 1.027 (ref 1.001–1.03)
pH: 5.5 (ref 5.0–8.0)

## 2017-03-23 LAB — INSULIN, FASTING: Insulin: 19.3 u[IU]/mL (ref 2.0–19.6)

## 2017-03-23 LAB — URINE CULTURE
MICRO NUMBER: 81012205
SPECIMEN QUALITY: ADEQUATE

## 2017-03-23 LAB — VITAMIN D 25 HYDROXY (VIT D DEFICIENCY, FRACTURES): Vit D, 25-Hydroxy: 37 ng/mL (ref 30–100)

## 2017-03-23 LAB — BASIC METABOLIC PANEL WITH GFR
BUN: 17 mg/dL (ref 7–25)
CO2: 25 mmol/L (ref 20–32)
CREATININE: 1.13 mg/dL (ref 0.60–1.35)
Calcium: 9.4 mg/dL (ref 8.6–10.3)
Chloride: 105 mmol/L (ref 98–110)
GFR, Est African American: 89 mL/min/{1.73_m2} (ref >=60)
GFR, Est Non African American: 76 mL/min/{1.73_m2} (ref >=60)
GLUCOSE: 94 mg/dL (ref 65–99)
Potassium: 4.4 mmol/L (ref 3.5–5.3)
Sodium: 140 mmol/L (ref 135–146)

## 2017-03-23 LAB — IRON, TOTAL/TOTAL IRON BINDING CAP
%SAT: 22 % (calc) (ref 15–60)
Iron: 66 ug/dL (ref 50–180)
TIBC: 295 ug/dL (ref 250–425)

## 2017-03-23 LAB — MICROALBUMIN / CREATININE URINE RATIO
Creatinine, Urine: 222 mg/dL (ref 20–320)
Microalb Creat Ratio: 3 mcg/mg creat (ref <30)
Microalb, Ur: 0.6 mg/dL

## 2017-03-23 LAB — HEMOGLOBIN A1C
Hgb A1c MFr Bld: 4.8 % of total Hgb (ref <5.7)
Mean Plasma Glucose: 91 (calc)
eAG (mmol/L): 5 (calc)

## 2017-03-23 LAB — PSA: PSA: 1.7 ng/mL (ref <=4.0)

## 2017-03-23 LAB — VITAMIN B12: Vitamin B-12: 358 pg/mL (ref 200–1100)

## 2017-03-23 LAB — TSH: TSH: 2.02 mIU/L (ref 0.40–4.50)

## 2017-03-23 LAB — TESTOSTERONE: Testosterone: 500 ng/dL (ref 250–827)

## 2017-03-23 LAB — MAGNESIUM: Magnesium: 2.1 mg/dL (ref 1.5–2.5)

## 2017-04-10 ENCOUNTER — Encounter: Payer: Self-pay | Admitting: *Deleted

## 2017-06-14 ENCOUNTER — Ambulatory Visit: Payer: Self-pay | Admitting: Adult Health

## 2017-06-15 ENCOUNTER — Ambulatory Visit (INDEPENDENT_AMBULATORY_CARE_PROVIDER_SITE_OTHER): Payer: Managed Care, Other (non HMO) | Admitting: Internal Medicine

## 2017-06-15 ENCOUNTER — Encounter: Payer: Self-pay | Admitting: Internal Medicine

## 2017-06-15 VITALS — BP 118/76 | HR 78 | Temp 97.9°F | Resp 16 | Ht 68.0 in | Wt 191.6 lb

## 2017-06-15 DIAGNOSIS — M7700 Medial epicondylitis, unspecified elbow: Secondary | ICD-10-CM | POA: Diagnosis not present

## 2017-06-15 MED ORDER — PREDNISONE 20 MG PO TABS: ORAL_TABLET | ORAL | 1 refills | Status: DC

## 2017-06-15 NOTE — Progress Notes (Signed)
  Subjective:    Patient ID: Larry Carter, male    DOB: Apr 08, 1966, 51 y.o.   MRN: [id number]  HPI  This nice RH 51 yo MWM present with a 2 month prodrome of bilat elbow pain exacerbated with certain activities as pushing/pulling. He denies discrete injury.    No Meds  Allergies  Allergen Reactions  . Aspirin Rash  . Penicillins Swelling   Review of Systems  10 point systems review negative except as above.     Objective:   Physical Exam  BP 118/76   Pulse 78   Temp 97.9 F (36.6 C)   Resp 16   Ht 5\' 8"  (1.727 m)   Wt 191 lb 9.6 oz (86.9 kg)   SpO2 97%   BMI 29.13 kg/m   HEENT - WNL. Neck - supple.  Chest - Clear. Cor - Nl HS. RRR w/o sig m. MS- FROM w/o deformities.   Has trigger point tenderness on bilat medial epicondyles R>L. Laterals ate OK. Gait Nl. Neuro -  Nl w/o focal abnormalities.    Assessment & Plan:   1. Medial epicondylitis of elbow, unspecified laterality  - predniSONE (DELTASONE) 20 MG tablet; 1 tab 3 x day for 3 days, then 1 tab 2 x day for 3 days, then 1 tab 1 x day for 5 days  Dispense: 20 tablet; Refill: 1  - Recc 2" elastic strap/ brace(s)

## 2017-06-15 NOTE — Patient Instructions (Addendum)
Golfer's Elbow Golfer's elbow, also called medial epicondylitis, is a condition that results from inflammation of the strong bands of tissue (tendons) that attach your forearm muscles to the inside of your bone at the elbow. These tendons affect the muscles that bend the palm toward the wrist (flexion). This condition is called golfer's elbow because it is more common among people who constantly bend and twist their wrists, such as golfers. This injury usually results from overuse. Tendons also become less flexible with age. This condition causes elbow pain that may spread to your forearm and upper arm. The pain may get worse when you bend your wrist downward. What are the causes? This condition is an overuse injury that is caused by:  Repeatedly flexing, turning, or twisting your wrist.  Constantly gripping objects with your hands.  What increases the risk? This condition is more likely to develop in people who play golf or tennis or have jobs that require the constant use of their hands. This injury is more common among:  Carpenters.  Gardeners.  Musicians.  Bricklayers.  Typists.  What are the signs or symptoms? Symptoms of this condition include:  Pain near the inner elbow or forearm.  Reduced grip strength.  How is this diagnosed? This condition is diagnosed based on your symptoms, medical history, and physical exam. During the exam, your health care provider may test your grip strength and move your wrist to check for pain. You may also have an MRI to confirm the diagnosis, look for other issues, and check for tears in the ligaments, muscles, or tendons. How is this treated? Treatment for this condition includes:  Stopping all activities that make you bend or twist your wrist until your pain and other symptoms go away.  Icing your wrist to relieve pain.  Taking NSAIDs or getting corticosteroid injections to reduce pain and swelling.  Doing stretches, range-of-motion,  and strengthening exercises (physical therapy) as told by your health care provider.  In rare cases, surgery may be needed if your condition does not improve. Follow these instructions at home:  If directed, apply ice to the injured area. ? Put ice in a plastic bag. ? Place a towel between your skin and the bag. ? Leave the ice on for 20 minutes, 2-3 times a day.  Move your fingers often to avoid stiffness.  Raise (elevate) the injured area above the level of your heart while you are sitting or lying down.  Return to your normal activities as told by your health care provider. Ask your health care provider what activities are safe for you.  Do exercises as told by your health care provider.  Do not use tobacco products, including cigarettes, chewing tobacco, or e-cigarettes. If you need help quitting, ask your health care provider.  Take over-the-counter and prescription medicines only as told by your health care provider.  Keep all follow-up visits as told by your health care provider. This is important. How is this prevented?  Warm up and stretch before being active.  Cool down and stretch after being active.  Give your body time to rest between periods of activity.  Make sure to use equipment that fits you.  Be safe and responsible while being active to avoid falls.  Do at least 150 minutes of moderate-intensity exercise each week, such as brisk walking or water aerobics.  Maintain physical fitness, including: ? Strength. ? Flexibility. ? Cardiovascular fitness. ? Endurance.  Perform exercises to strengthen the forearm muscles.  Slow your golf   swing to reduce shock in the arm when making contact with the ball, if you play golf. Contact a health care provider if:  Your pain does not improve or it gets worse.  You notice numbness in your hand. Get help right away if:  Your pain is severe.  You cannot move your wrist.     Golfer's Elbow Rehab  Ask your  health care provider which exercises are safe for you. Do exercises exactly as told by your health care provider and adjust them as directed. It is normal to feel mild stretching, pulling, tightness, or discomfort as you do these exercises, but you should stop right away if you feel sudden pain or your pain gets worse. Do not begin these exercises until told by your health care provider. Stretching and range of motion exercises These exercises warm up your muscles and joints and improve the movement and flexibility of your elbow. Exercise A: Wrist flexors  1. Straighten your left / right elbow in front of you with your palm up. If told by your health care provider, do this stretch with your elbow bent rather than straight. 2. With your other hand, gently pull your left / right hand and fingers toward you until you feel a gentle stretch on the top of your forearm. 3. Hold this position for __________ seconds. Repeat __________ times. Complete this exercise __________ times a day. Strengthening exercises These exercises build strength and endurance in your elbow. Endurance is the ability to use your muscles for a long time, even after several repetitions. Exercise B: Wrist flexion  1. Sit with your left / right forearm palm-up and supported on a table or other surface. 2. Let your left / right wrist extend over the edge of the surface. 3. Hold a __________ weight or a piece of rubber exercise band or tubing. Slowly curl your hand up toward your forearm. Try to only move your hand and keep the rest of your arm still. 4. Hold this position for __________ seconds. 5. Slowly return to the starting position. Repeat __________ times. Complete this exercise __________ times a day. Exercise C: Eccentric wrist flexion 1. Sit with your left / right forearm palm-up and supported on a table or other surface. 2. Let your left / right wrist extend over the edge of the surface. 3. Hold a __________ weight or a  piece of rubber exercise band or tubing. 4. Use your other hand to move your left / right hand up toward your forearm. 5. Slowly return to the starting position using only your left / right hand. Repeat __________ times. Complete this exercise __________ times a day. Exercise D: Hand turns, pronation i 1. Sit with your left / right forearm supported on a table or other surface. 2. Move your wrist so your pinkie travels toward your forearm and your thumb moves away from your forearm. 3. Hold this position for __________ seconds. 4. Slowly return to the starting position. Exercise E: Hand turns, pronation ii  1. Sit with your left / right forearm supported on a table or other surface. 2. Hold a hammer in your left / right hand. ? The exercise will be easier if you hold on near the head of the hammer. ? If you hold on toward the end of the handle, the exercise will be harder. 3. Rest your left / right hand over the edge of the table with your palm facing up. 4. Without moving your elbow, slowly turn your palm and  your hand down toward the table. 5. Hold this position for __________ seconds. 6. Slowly return to the starting position. Repeat __________ times. Complete this exercise __________ times a day. Exercise F: Shoulder blade squeeze 1. Sit in a stable chair with good posture. Do not let your back touch the back of the chair. 2. Your arms should be at your sides with your elbows bent. You can rest your forearms on a pillow. 3. Squeeze your shoulder blades together. Keep your shoulders level. Do not lift your shoulders up toward your ears. 4. Hold this position for __________ seconds. 5. Slowly release. Repeat __________ times. Complete this exercise __________ times a day. This information is not intended to replace advice given to you by your health care provider. Make sure you discuss any questions you have with your health care provider. Document Released: 06/26/2005 Document Revised:  03/02/2016 Document Reviewed: 03/08/2015 Elsevier Interactive Patient Education  [employer].

## 2017-07-05 ENCOUNTER — Ambulatory Visit: Payer: Self-pay | Admitting: Adult Health

## 2017-12-27 ENCOUNTER — Ambulatory Visit (INDEPENDENT_AMBULATORY_CARE_PROVIDER_SITE_OTHER): Payer: Managed Care, Other (non HMO) | Admitting: Internal Medicine

## 2017-12-27 VITALS — BP 124/82 | HR 72 | Temp 97.7°F | Resp 16 | Ht 68.0 in | Wt 200.0 lb

## 2017-12-27 DIAGNOSIS — M2550 Pain in unspecified joint: Secondary | ICD-10-CM

## 2017-12-27 DIAGNOSIS — E559 Vitamin D deficiency, unspecified: Secondary | ICD-10-CM

## 2017-12-27 DIAGNOSIS — Z79899 Other long term (current) drug therapy: Secondary | ICD-10-CM | POA: Diagnosis not present

## 2017-12-27 NOTE — Progress Notes (Signed)
  Subjective:    Patient ID: Larry Carter, male    DOB: 1966/06/15, 52 y.o.   MRN: [id number]  HPI  This very nice 52 yo MWM presents with c/o multiple arthralgias. He is particularly concerned , because his mother & 2 maternal aunts apparently have Rheumatoid Arthritis.  He does endorse aching and stiffness of the small joints of the hands exacerbated by use. He also has a 4+ yr hx/o LBP and bilat knee pains x years.   No Current meds  Allergies  Allergen Reactions  . Aspirin Rash  . Penicillins Swelling   Past Medical History:  Diagnosis Date  . Allergic rhinitis   . Arthritis   . Urticaria    "itchy skin that swells when I scratch it" x years, random--referred to allergist 04/2014 and pt no-showed.   Review of Systems 10 point systems review negative except as above.    Objective:   Physical Exam  BP 124/82   Pulse 72   Temp 97.7 F (36.5 C)   Resp 16   Ht 5\' 8"  (1.727 m)   Wt 200 lb (90.7 kg)   BMI 30.41 kg/m   HEENT - WNL. Neck - supple.  Chest - Clear equal BS. Cor - Nl HS. RRR w/o sig MGR. PP 1(+). No edema. MS- FROM w/o deformities.  Sl tender over small jts of the hands w/o definite synovitis and no deformities noted. Gait Nl. Neuro -  Nl w/o focal abnormalities.    Assessment & Plan:   1. Arthralgias, r/o CTD  - CBC with Differential/Platelet - Sedimentation rate - Anti-DNA antibody, double-stranded - C3 and C4 - Cyclic citrul peptide antibody, IgG - C-reactive protein - Rheumatoid factor - Anti-Smith antibody  2. Vitamin D deficiency  - VITAMIN D 25 Hydroxy   3. Medication management  - CBC with Differential/Platelet

## 2017-12-27 NOTE — Patient Instructions (Signed)
Rheumatoid Arthritis Rheumatoid arthritis (RA) is a long-term (chronic) disease that causes inflammation in your joints. RA may start slowly. It usually affects the small joints of the hands and feet. Usually, the same joints are affected on both sides of your body. Inflammation from RA can also affect other parts of your body, including your heart, eyes, or lungs. RA is an autoimmune disease. That means that your body's defense system (immune system) mistakenly attacks healthy body tissues. There is no cure for RA, but medicines can help your symptoms and halt or slow down the progression of the disease. What are the causes? The exact cause of RA is not known. What increases the risk? This condition is more likely to develop in:  Women.  People who have a family history of RA or other autoimmune diseases.  What are the signs or symptoms? Symptoms of this condition vary from person to person. Symptoms usually start gradually. They are often worse in the morning. The first symptom may be morning stiffness that lasts longer than 30 minutes. As RA progresses, symptoms may include:  Pain, stiffness, swelling, warmth, and tenderness in joints on both sides of your body.  Loss of energy.  Loss of appetite.  Weight loss.  Low-grade fever.  Dry eyes and dry mouth.  Firm lumps (rheumatoid nodules) that grow beneath your skin in areas such as your forearm bones near your elbows and on your hands.  Changes in the appearance of joints (deformity) and loss of joint function.  Symptoms of RA often come and go. Sometimes, symptoms get worse for a period of time. These are called flares. How is this diagnosed? This condition is diagnosed based on your symptoms, medical history, and physical exam. You may have X-rays or MRI to check for the type of joint changes that are caused by RA. You may also have blood tests to look for:  Proteins (antibodies) that your immune system may make if you have RA.  They include rheumatoid factor (RF) and anti-CCP. ? When blood tests show these proteins, you are said to have "seropositive RA." ? When blood tests do not show these proteins, you may have "seronegative RA."  Inflammation in your blood.  A low number of red blood cells (anemia).  How is this treated? The goals of treatment are to relieve pain, reduce inflammation, and slow down or stop joint damage and disability. Treatment may include:  Lifestyle changes. It is important to rest, eat a healthy diet, and exercise.  Medicines. Your health care provider may adjust your medicines every 3 months until treatment goals are reached. Common medicines include: ? Pain relievers (analgesics). ? Corticosteroids and NSAIDs to reduce inflammation. ? Disease-modifying antirheumatic drugs (DMARDs) to try to slow the course of the disease. ? Biologic response modifiers to reduce inflammation and damage.  Physical therapy and occupational therapy.  Surgery, if you have severe joint damage. Joint replacement or fusing of joints may be needed.  Your health care provider will work with you to identify the best treatment option for you based on assessment of the overall disease activity in your body. Follow these instructions at home:  Take over-the-counter and prescription medicines only as told by your health care provider.  Start an exercise program as told by your health care provider.  Rest when you are having a flare.  Return to your normal activities as told by your health care provider. Ask your health care provider what activities are safe for you.  Keep all   follow-up visits as told by your health care provider. This is important. Where to find more information:  [employer]: www.rheumatology.[place]: www.arthritis.org Contact a health care provider if:  You have a flare-up of RA symptoms.  You have a fever.  You have side effects from your  medicines. Get help right away if:  You have chest pain.  You have trouble breathing.  You quickly develop a hot, painful joint that is more severe than your usual joint aches. This information is not intended to replace advice given to you by your health care provider. Make sure you discuss any questions you have with your health care provider. Document Released: 06/23/2000 Document Revised: 11/30/2015 Document Reviewed: 04/08/2015 Elsevier Interactive Patient Education  2018 [employer]. Anticyclic-Citrullinated Peptide Antibody Test Why am I having this test? The anticyclic-citrullinated peptide antibody test may be done to:  Help your health care provider diagnose rheumatoid arthritis.  Determine the severity and progression of your rheumatoid arthritis.  This test may be done if you have unexplained joint inflammation and have previously tested negative for rheumatoid factor. It may also be done if you have been diagnosed with undifferentiated arthritis and your health care provider suspects rheumatoid arthritis. What kind of sample is taken? A blood sample is required for this test. It is usually collected by inserting a needle into a vein. How do I prepare for this test? There is no preparation required for this test. How are the results reported? Your test results will be reported as either positive or negative. It is your responsibility to obtain your test results. Ask the lab or department performing the test when and how you will get your results. What do the results mean?  A positive blood test may mean that you have rheumatoid arthritis.  A negative blood test means that it is unlikely that you have rheumatoid arthritis. Talk with your health care provider to discuss your results, treatment options, and if necessary, the need for more tests. Talk with your health care provider if you have any questions about your results. Talk with your health care provider to discuss  your results, treatment options, and if necessary, the need for more tests. Talk with your health care provider if you have any questions about your results. This information is not intended to replace advice given to you by your health care provider. Make sure you discuss any questions you have with your health care provider. Document Released: 07/18/2004 Document Revised: 02/28/2016 Document Reviewed: 11/19/2013 Elsevier Interactive Patient Education  2018 [place]. C-Reactive Protein Test C-reactive protein (CRP) is a substance released by the liver in response to inflammation. The level of CRP in your body increases greatly after a heart attack, an infection, or an injury. High levels of CRP in your body can indicate an infection. This can help your health care provider determine whether you have a serious bacterial or fungal infection. Elevated levels of CRP can also help health care providers identify conditions that can be difficult to diagnose. These include:  Cancer.  Arthritis.  Inflammatory bowel disease.  Lupus or other autoimmune diseases.  A high-sensitivity C-reactive protein (hs-CRP) assay is a more sensitive test. It is used to measure your risk for heart disease. The test for CRP is a blood test. It requires a blood sample from a vein in your arm. What do the results mean? It is your responsibility to obtain your test results. Ask the lab or department performing the test  when and how you will get your results. Contact your health care provider to discuss any questions you have about your results. The results of a CRP test will be a range of values. CRP levels that are above the normal range may indicate a problem. Range of Normal Values Ranges for normal values vary among different labs and hospitals. You should always check with your health care provider after having lab work or other tests done to discuss whether your values are considered within normal limits. For a  standard CRP test, the normal value is zero. The standard CRP test usually measures levels of CRP from 10 to 1,000 milligrams per liter (mg/L). The hs-CRP can detect CRP levels between 0.5 and 10 mg/L.  A level of 1.0 mg/L and lower may indicate a low risk for heart disease.  A level between 1.0 and 3.0 mg/L may represent an average risk for heart disease.  A level of 3.0 mg/L or higher may indicate a high risk for heart disease.  Meaning of Results Outside Normal Range Values A high CRP level only indicates that there is inflammation. This can be caused by many different conditions, includinginfection, arthritis, and autoimmune diseases. It can also be a marker for cancer, a heart attack, or internal injury. Health care providers also use other tests and consider your signs and symptoms when diagnosing your condition. Talk with your health care provider to discuss your results, treatment options, and if necessary, the need for more tests. Talk with your health care provider if you have any questions about your results. This information is not intended to replace advice given to you by your health care provider. Make sure you discuss any questions you have with your health care provider. Document Released: 07/19/2004 Document Revised: 02/29/2016 Document Reviewed: 09/23/2013 Elsevier Interactive Patient Education  2018 [place] is a term that is commonly used to refer to joint pain or joint disease. There are more than 100 types of arthritis. What are the causes? The most common cause of this condition is wear and tear of a joint. Other causes include:  Gout.  Inflammation of a joint.  An infection of a joint.  Sprains and other injuries near the joint.  A drug reaction or allergic reaction.  In some cases, the cause may not be known. What are the signs or symptoms? The main symptom of this condition is pain in the joint with movement. Other symptoms  include:  Redness, swelling, or stiffness at a joint.  Warmth coming from the joint.  Fever.  Overall feeling of illness.  How is this diagnosed? This condition may be diagnosed with a physical exam and tests, including:  Blood tests.  Urine tests.  Imaging tests, such as MRI, X-rays, or a CT scan.  Sometimes, fluid is removed from a joint for testing. How is this treated? Treatment for this condition may involve:  Treatment of the cause, if it is known.  Rest.  Raising (elevating) the joint.  Applying cold or hot packs to the joint.  Medicines to improve symptoms and reduce inflammation.  Injections of a steroid such as cortisone into the joint to help reduce pain and inflammation.  Depending on the cause of your arthritis, you may need to make lifestyle changes to reduce stress on your joint. These changes may include exercising more and losing weight. Follow these instructions at home: Medicines  Take over-the-counter and prescription medicines only as told by your health care provider.  Do not take aspirin to relieve pain if gout is suspected. Activity  Rest your joint if told by your health care provider. Rest is important when your disease is active and your joint feels painful, swollen, or stiff.  Avoid activities that make the pain worse. It is important to balance activity with rest.  Exercise your joint regularly with range-of-motion exercises as told by your health care provider. Try doing low-impact exercise, such as: ? Swimming. ? Water aerobics. ? Biking. ? Walking. Joint Care   If your joint is swollen, keep it elevated if told by your health care provider.  If your joint feels stiff in the morning, try taking a warm shower.  If directed, apply heat to the joint. If you have diabetes, do not apply heat without permission from your health care provider. ? Put a towel between the joint and the hot pack or heating pad. ? Leave the heat on the  area for 20-30 minutes.  If directed, apply ice to the joint: ? Put ice in a plastic bag. ? Place a towel between your skin and the bag. ? Leave the ice on for 20 minutes, 2-3 times per day.  Keep all follow-up visits as told by your health care provider. This is important. Contact a health care provider if:  The pain gets worse.  You have a fever. Get help right away if:  You develop severe joint pain, swelling, or redness.  Many joints become painful and swollen.  You develop severe back pain.  You develop severe weakness in your leg.  You cannot control your bladder or bowels. This information is not intended to replace advice given to you by your health care provider. Make sure you discuss any questions you have with your health care provider. Document Released: 08/03/2004 Document Revised: 12/02/2015 Document Reviewed: 09/21/2014 Elsevier Interactive Patient Education  [employer].

## 2017-12-28 LAB — CBC WITH DIFFERENTIAL/PLATELET
BASOS ABS: 39 {cells}/uL (ref 0–200)
Basophils Relative: 0.6 %
EOS ABS: 189 {cells}/uL (ref 15–500)
Eosinophils Relative: 2.9 %
HCT: 41.1 % (ref 38.5–50.0)
Hemoglobin: 14.5 g/dL (ref 13.2–17.1)
Lymphs Abs: 1339 cells/uL (ref 850–3900)
MCH: 29.8 pg (ref 27.0–33.0)
MCHC: 35.3 g/dL (ref 32.0–36.0)
MCV: 84.6 fL (ref 80.0–100.0)
MONOS PCT: 10.5 %
MPV: 9.8 fL (ref 7.5–12.5)
Neutro Abs: 4251 cells/uL (ref 1500–7800)
Neutrophils Relative %: 65.4 %
PLATELETS: 282 10*3/uL (ref 140–400)
RBC: 4.86 10*6/uL (ref 4.20–5.80)
RDW: 13 % (ref 11.0–15.0)
TOTAL LYMPHOCYTE: 20.6 %
WBC mixed population: 683 cells/uL (ref 200–950)
WBC: 6.5 10*3/uL (ref 3.8–10.8)

## 2017-12-28 LAB — C3 AND C4
C3 Complement: 115 mg/dL (ref 82–185)
C4 Complement: 22 mg/dL (ref 15–53)

## 2017-12-28 LAB — SEDIMENTATION RATE: SED RATE: 2 mm/h (ref 0–15)

## 2017-12-28 LAB — C-REACTIVE PROTEIN: CRP: 2.8 mg/L (ref <8.0)

## 2017-12-28 LAB — VITAMIN D 25 HYDROXY (VIT D DEFICIENCY, FRACTURES): VIT D 25 HYDROXY: 26 ng/mL — AB (ref 30–100)

## 2017-12-28 LAB — ANTI-SMITH ANTIBODY: ENA SM AB SER-ACNC: NEGATIVE AI

## 2017-12-28 LAB — ANTI-DNA ANTIBODY, DOUBLE-STRANDED: DS DNA AB: 2 [IU]/mL

## 2017-12-28 LAB — RHEUMATOID FACTOR

## 2017-12-28 LAB — CYCLIC CITRUL PEPTIDE ANTIBODY, IGG: Cyclic Citrullin Peptide Ab: 16 UNITS

## 2017-12-30 ENCOUNTER — Encounter: Payer: Self-pay | Admitting: Internal Medicine

## 2018-04-09 ENCOUNTER — Encounter: Payer: Self-pay | Admitting: Internal Medicine

## 2018-04-22 ENCOUNTER — Encounter: Payer: Self-pay | Admitting: Internal Medicine

## 2018-12-31 ENCOUNTER — Other Ambulatory Visit: Payer: Self-pay | Admitting: Internal Medicine

## 2018-12-31 DIAGNOSIS — M2550 Pain in unspecified joint: Secondary | ICD-10-CM

## 2019-05-14 ENCOUNTER — Encounter: Payer: Self-pay | Admitting: Internal Medicine

## 2019-11-07 ENCOUNTER — Ambulatory Visit (INDEPENDENT_AMBULATORY_CARE_PROVIDER_SITE_OTHER): Payer: Managed Care, Other (non HMO) | Admitting: Physician Assistant

## 2019-11-07 ENCOUNTER — Encounter: Payer: Self-pay | Admitting: Gastroenterology

## 2019-11-07 ENCOUNTER — Other Ambulatory Visit: Payer: Self-pay

## 2019-11-07 ENCOUNTER — Encounter: Payer: Self-pay | Admitting: Physician Assistant

## 2019-11-07 VITALS — BP 122/80 | HR 73 | Temp 97.3°F | Wt 189.0 lb

## 2019-11-07 DIAGNOSIS — Z1211 Encounter for screening for malignant neoplasm of colon: Secondary | ICD-10-CM

## 2019-11-07 DIAGNOSIS — R002 Palpitations: Secondary | ICD-10-CM

## 2019-11-07 DIAGNOSIS — Z13220 Encounter for screening for lipoid disorders: Secondary | ICD-10-CM | POA: Diagnosis not present

## 2019-11-07 DIAGNOSIS — Z130 Encounter for screening for diseases of the blood and blood-forming organs and certain disorders involving the immune mechanism: Secondary | ICD-10-CM

## 2019-11-07 DIAGNOSIS — Z79899 Other long term (current) drug therapy: Secondary | ICD-10-CM | POA: Diagnosis not present

## 2019-11-07 NOTE — Progress Notes (Signed)
Subjective:    Patient ID: Larry Carter, male    DOB: [birth year], 54 y.o.   MRN: [id number]  HPI 54 y.o. WM with normal history, no family history of CAD presents with palpitations.   He will get occ rapid heart beat. States he had years before when hew as younger playing baseball, lasting minutes with SOB and going away but did not have any issues until July. He was traveling with his wife, was watching TV,started to have rapid heart beat, lasted 5-56min, he did have tightness in his left arm and felt "drained" with no energy, had to lay down on the couch, no diaphoresis, no CP, no SOB, no dizziness. Then the next night he got out of a hot tub and had the same sensation, lasted about 15 mins.   Then last month, he was traveling again, had palpitations again lasting an hour and 10 mins, he counted his heart rate and it was 180-210- he had no symptoms that time. Then it happened again this past week with sluggishness, left arm tightness, and he did notice ankle swelling.   He had a normal thyroid, no anemia and normal kidney function last visit.  No ETOH, no smoking history, no family history. No HTN, no chol.   He does not drink a lot of water, he drinks diet tea and diet pepsi- caffeine beverages. 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 Never has symptoms with exertion.  Sleeps well, nocturia 1-2 x, does not feel rested in the AM. He does snore but him and his wife are able to sleep together.   Blood pressure 122/80, pulse 73, temperature (!) 97.3 F (36.3 C), weight 189 lb (85.7 kg), SpO2 98 %.   Lab Results  Component Value Date   WBC 6.5 12/27/2017   HGB 14.5 12/27/2017   HCT 41.1 12/27/2017   MCV 84.6 12/27/2017   PLT 282 12/27/2017   Lab Results  Component Value Date   CREATININE 1.13 03/22/2017   BUN 17 03/22/2017   NA 140 03/22/2017   K 4.4 03/22/2017   CL 105 03/22/2017   CO2 25 03/22/2017   Lab Results  Component Value  Date   TSH 2.02 03/22/2017   Colonooscopy Lab Results  Component Value Date   IRON 66 03/22/2017   TIBC 295 03/22/2017   FERRITIN 193 05/25/2016    Blood pressure 122/80, pulse 73, temperature (!) 97.3 F (36.3 C), weight 189 lb (85.7 kg), SpO2 98 %.  Medications No current outpatient medications on file.  Problem list He has Family history of prostate cancer and Abrasion of upper arm, left, infected on their problem list.  His family history includes Arthritis in his mother; Asthma in his brother; Prostate cancer (age of onset: 86) in his father.   Review of Systems  Constitutional: Positive for fatigue. Negative for activity change, appetite change, chills, diaphoresis, fever and unexpected weight change.  HENT: Negative.   Respiratory: Negative.  Negative for shortness of breath.   Cardiovascular: Positive for palpitations and leg swelling. Negative for chest pain (left arm tightness).  Gastrointestinal: Negative.   Genitourinary: Negative.   Musculoskeletal: Negative.   Neurological: Negative.  Negative for dizziness.  Psychiatric/Behavioral: Negative.        Objective:   Physical Exam Constitutional:      Appearance: He is well-developed.  HENT:     Head: Normocephalic and atraumatic.     Right Ear: External ear normal.  Left Ear: External ear normal.     Nose: Nose normal.  Eyes:     Conjunctiva/sclera: Conjunctivae normal.     Pupils: Pupils are equal, round, and reactive to light.  Cardiovascular:     Rate and Rhythm: Normal rate and regular rhythm.     Heart sounds: Normal heart sounds. No murmur. No friction rub. No gallop.   Pulmonary:     Effort: Pulmonary effort is normal. No respiratory distress.     Breath sounds: Normal breath sounds. No wheezing or rales.  Chest:     Chest wall: No tenderness.  Abdominal:     General: Bowel sounds are normal. There is no distension.     Palpations: Abdomen is soft.     Tenderness: There is no abdominal  tenderness. There is no guarding or rebound.  Musculoskeletal:        General: Normal range of motion.     Cervical back: Normal range of motion and neck supple.  Skin:    General: Skin is warm and dry.  Neurological:     Mental Status: He is alert and oriented to person, place, and time.        Assessment & Plan:  Larry Carter was seen today for tachycardia.  Diagnoses and all orders for this visit:  Screening cholesterol level -     Lipid panel  Palpitations -     CBC with Differential/Platelet -     COMPLETE METABOLIC PANEL WITH GFR -     TSH -     EKG 12-Lead -     Ambulatory referral to Cardiology  check labs, r/u anemia, TSH, dehydration, electrolytes imbalance declines pill in pocket if worse will go to ER Patient is low risk, no family history, low risk factors, will check those today but with story will refer to cardiology for evaluation Increase water, decrease alcohol/caffeine, valsalva maneuvers taught   Screening, anemia, deficiency, iron -     Iron,Total/Total Iron Binding Cap -     Ferritin  Medication management -     Magnesium  Screen for colon cancer -     Ambulatory referral to Gastroenterology He is over due

## 2019-11-07 NOTE — Patient Instructions (Signed)
Going to refer for colonoscopy  Colon cancer is 3rd most diagnosed cancer and 2nd leading cause of death in both men and women 52 years of age and older despite being one of the most preventable and treatable cancers if found early.  4 of out 5 people diagnosed with colon cancer have NO prior family history.  When caught EARLY 90% of colon cancer is curable.     Palpitations Palpitations are feelings that your heartbeat is irregular or is faster than normal. It may feel like your heart is fluttering or skipping a beat. Palpitations are usually not a serious problem. They may be caused by many things, including smoking, caffeine, alcohol, stress, and certain medicines or drugs. Most causes of palpitations are not serious. However, some palpitations can be a sign of a serious problem. You may need further tests to rule out serious medical problems. Follow these instructions at home:     Pay attention to any changes in your condition. Take these actions to help manage your symptoms: Eating and drinking  Avoid foods and drinks that may cause palpitations. These may include: ? Caffeinated coffee, tea, soft drinks, diet pills, and energy drinks. ? Chocolate. ? Alcohol. Lifestyle  Take steps to reduce your stress and anxiety. Things that can help you relax include: ? Yoga. ? Mind-body activities, such as deep breathing, meditation, or using words and images to create positive thoughts (guided imagery). ? Physical activity, such as swimming, jogging, or walking. Tell your health care provider if your palpitations increase with activity. If you have chest pain or shortness of breath with activity, do not continue the activity until you are seen by your health care provider. ? Biofeedback. This is a method that helps you learn to use your mind to control things in your body, such as your heartbeat.  Do not use drugs, including cocaine or ecstasy. Do not use marijuana.  Get plenty of rest and  sleep. Keep a regular bed time. General instructions  Take over-the-counter and prescription medicines only as told by your health care provider.  Do not use any products that contain nicotine or tobacco, such as cigarettes and e-cigarettes. If you need help quitting, ask your health care provider.  Keep all follow-up visits as told by your health care provider. This is important. These may include visits for further testing if palpitations do not go away or get worse. Contact a health care provider if you:  Continue to have a fast or irregular heartbeat after 24 hours.  Notice that your palpitations occur more often. Get help right away if you:  Have chest pain or shortness of breath.  Have a severe headache.  Feel dizzy or you faint. Summary  Palpitations are feelings that your heartbeat is irregular or is faster than normal. It may feel like your heart is fluttering or skipping a beat.  Palpitations may be caused by many things, including smoking, caffeine, alcohol, stress, certain medicines, and drugs.  Although most causes of palpitations are not serious, some causes can be a sign of a serious medical problem.  Get help right away if you faint or have chest pain, shortness of breath, a severe headache, or dizziness. This information is not intended to replace advice given to you by your health care provider. Make sure you discuss any questions you have with your health care provider. Document Revised: 08/08/2017 Document Reviewed: 08/08/2017 Elsevier Patient Education  [employer].  Here is some information about the vaccines. The data  is very good and this information hopefully answers a lot of your questions and give you a confidence boost.   The Pfizer and Moderna vaccines are messenger RNA vaccines. That technology is not new, it has been studied for 20 years at least, used for cancer and MS treatment. They had started using the vaccine for MERS AND SARS (both a  different coronavirus) 10 years ago but never finished so we had a good backbone for this vaccine.   There were no short cuts with the techniques for these clinical  trials, just lots of willing participants quickly and lots of up front money helped speed up the process.   The mRNA is very fragile which is why it needs to be kept so cold and thawed a certain way, think of it as a message in a glass bottle. NO PART of the virus is in this vaccine, it is a clip of the genetic sequence. This mRNA is injected in your arm, connects with a ribosome, delivers the message and the degrades.  That is part of the cause of the sore arm, the mRNA never leaves your arm. It degrades there. The mRNA does not go into our nucleotide where our DNA is, and we would need a DNA reverse transcriptase to take RNA to DNA, we do not have this, it can not change our DNA. The ribosome that got the message creates a protein and that protein circulates in our body and we have an immune reaction to that creating antibodies. Any time our immune system is triggered, inflammation is triggered too so you can have a temp, muscle aches, etc. Normal reaction.  I have seen so many patients that just had mild COVID in the office weeks later still have issues. We are still learning about post COVID syndrome, the CDC should be coming out for guidelines for practioners soon. There is too much unknown about COVID. We have been using vaccines for over 100 years or more, i Personnel officer. Ask your parents or any older friends about polio and that vaccine, that was a disease shutting down schools,had kids in iron lung, devastating young kids and families. People lined up for that vaccine and technology has only improved.   Please get the vaccine. If you have any further questions please make an appointment in the office to discuss further.  Larry Carter

## 2019-11-08 LAB — IRON, TOTAL/TOTAL IRON BINDING CAP
%SAT: 41 % (calc) (ref 20–48)
Iron: 126 ug/dL (ref 50–180)
TIBC: 309 mcg/dL (calc) (ref 250–425)

## 2019-11-08 LAB — CBC WITH DIFFERENTIAL/PLATELET
Absolute Monocytes: 455 cells/uL (ref 200–950)
Basophils Absolute: 40 cells/uL (ref 0–200)
Basophils Relative: 0.8 %
Eosinophils Absolute: 120 cells/uL (ref 15–500)
Eosinophils Relative: 2.4 %
HCT: 42.7 % (ref 38.5–50.0)
Hemoglobin: 14.6 g/dL (ref 13.2–17.1)
Lymphs Abs: 1030 cells/uL (ref 850–3900)
MCH: 30 pg (ref 27.0–33.0)
MCHC: 34.2 g/dL (ref 32.0–36.0)
MCV: 87.7 fL (ref 80.0–100.0)
MPV: 9.4 fL (ref 7.5–12.5)
Monocytes Relative: 9.1 %
Neutro Abs: 3355 cells/uL (ref 1500–7800)
Neutrophils Relative %: 67.1 %
Platelets: 271 10*3/uL (ref 140–400)
RBC: 4.87 10*6/uL (ref 4.20–5.80)
RDW: 12.8 % (ref 11.0–15.0)
Total Lymphocyte: 20.6 %
WBC: 5 10*3/uL (ref 3.8–10.8)

## 2019-11-08 LAB — COMPLETE METABOLIC PANEL WITH GFR
AG Ratio: 2 (calc) (ref 1.0–2.5)
ALT: 11 U/L (ref 9–46)
AST: 12 U/L (ref 10–35)
Albumin: 4.1 g/dL (ref 3.6–5.1)
Alkaline phosphatase (APISO): 58 U/L (ref 35–144)
BUN: 16 mg/dL (ref 7–25)
CO2: 26 mmol/L (ref 20–32)
Calcium: 9.3 mg/dL (ref 8.6–10.3)
Chloride: 107 mmol/L (ref 98–110)
Creat: 1.09 mg/dL (ref 0.70–1.33)
GFR, Est African American: 91 mL/min/{1.73_m2} (ref >=60)
GFR, Est Non African American: 78 mL/min/{1.73_m2} (ref >=60)
Globulin: 2.1 g/dL (calc) (ref 1.9–3.7)
Glucose, Bld: 88 mg/dL (ref 65–99)
Potassium: 4.8 mmol/L (ref 3.5–5.3)
Sodium: 141 mmol/L (ref 135–146)
Total Bilirubin: 0.8 mg/dL (ref 0.2–1.2)
Total Protein: 6.2 g/dL (ref 6.1–8.1)

## 2019-11-08 LAB — LIPID PANEL
Cholesterol: 234 mg/dL — ABNORMAL HIGH (ref <200)
HDL: 67 mg/dL (ref >=40)
LDL Cholesterol (Calc): 151 mg/dL (calc) — ABNORMAL HIGH
Non-HDL Cholesterol (Calc): 167 mg/dL (calc) — ABNORMAL HIGH (ref <130)
Total CHOL/HDL Ratio: 3.5 (calc) (ref <5.0)
Triglycerides: 61 mg/dL (ref <150)

## 2019-11-08 LAB — FERRITIN: Ferritin: 132 ng/mL (ref 38–380)

## 2019-11-08 LAB — MAGNESIUM: Magnesium: 2.1 mg/dL (ref 1.5–2.5)

## 2019-11-08 LAB — TSH: TSH: 2.46 mIU/L (ref 0.40–4.50)

## 2019-11-28 ENCOUNTER — Ambulatory Visit: Payer: Self-pay | Admitting: Cardiology

## 2019-12-12 ENCOUNTER — Encounter: Payer: Self-pay | Admitting: Gastroenterology

## 2019-12-22 NOTE — Progress Notes (Signed)
Cardiology Office Note   Date:  12/23/2019   ID:  Larry Carter, DOB [birth year], MRN [id number]  PCP:  Larry Pinto, MD  Cardiologist:   No primary care provider on file. Referring:  Larry Pinto, MD  Chief Complaint  Patient presents with  . Palpitations      History of Present Illness: Larry Carter is a 54 y.o. male who was referred by Larry Pinto, MD for evaluation of tachycardia.  He describes rapid heart rates.  The first time happened really 30 years ago when he was playing some ball but not again until about a year ago in July.  He had some rapid heart rate and he felt very sluggish.  He had some left arm tightness.  It lasted for about 10 to 15 minutes.  He has had some triggers like when he was transferring from a hot tub to a pool.  However, he has had some episodes without triggers.  He has tried to record his pulse by just taking it and he is recorded it from 170-220.  Seems to break spontaneously.  He had an episode in February that lasted about 70 minutes.  Other episodes have been much briefer lasting only a few seconds.  He has not had any frank syncope.  He had an discomfort on the bottom of his foot with one episode.  His legs feel sluggish.  He otherwise feels well.  He has a job that is physical part of the time.  With this he denies any cardiovascular symptoms.  He does not have chest pressure, neck or arm discomfort with heavy physical activity.  He does not have any shortness of breath, PND or orthopnea.  He said no weight gain or edema.  Past Medical History:  Diagnosis Date  . Allergic rhinitis   . Allergy   . Arthritis   . Rapid heart beat   . Urticaria    "itchy skin that swells when I scratch it" x years, random--referred to allergist 04/2014 and pt no-showed.    Past Surgical History:  Procedure Laterality Date  . NO PAST SURGERIES       Current Outpatient Medications  Medication Sig Dispense Refill  . metoprolol tartrate  (LOPRESSOR) 25 MG tablet Take 1 tablet (25 mg total) by mouth every 12 (twelve) hours as needed (RAPID HEART RATE). 45 tablet 3   No current facility-administered medications for this visit.    Allergies:   Aspirin and Penicillins    Social History:  The patient  reports that he has never smoked. He has never used smokeless tobacco. He reports current alcohol use. He reports that he does not use drugs.   Family History:  The patient's family history includes Arthritis in his mother; Asthma in his brother; Prostate cancer (age of onset: 72) in his father.    ROS:  Please see the history of present illness.   Otherwise, review of systems are positive for none.   All other systems are reviewed and negative.    PHYSICAL EXAM: VS:  BP 127/72   Pulse 71   Ht 5\' 9"  (1.753 m)   Wt 188 lb (85.3 kg)   SpO2 97%   BMI 27.76 kg/m  , BMI Body mass index is 27.76 kg/m. GENERAL:  Well appearing HEENT:  Pupils equal round and reactive, fundi not visualized, oral mucosa unremarkable NECK:  No jugular venous distention, waveform within normal limits, carotid upstroke brisk and symmetric, no bruits, no thyromegaly LYMPHATICS:  No  cervical, inguinal adenopathy LUNGS:  Clear to auscultation bilaterally BACK:  No CVA tenderness CHEST:  Unremarkable HEART:  PMI not displaced or sustained,S1 and S2 within normal limits, no S3, no S4, no clicks, no rubs, no murmurs ABD:  Flat, positive bowel sounds normal in frequency in pitch, no bruits, no rebound, no guarding, no midline pulsatile mass, no hepatomegaly, no splenomegaly EXT:  2 plus pulses throughout, no edema, no cyanosis no clubbing SKIN:  No rashes no nodules NEURO:  Cranial nerves II through XII grossly intact, motor grossly intact throughout PSYCH:  Cognitively intact, oriented to person place and time    EKG:  EKG is ordered today. The ekg ordered today demonstrates sinus rhythm, rate 71, axis within normal limits, intervals within normal  limits, no acute ST-T wave changes.   Recent Labs: 11/07/2019: ALT 11; BUN 16; Creat 1.09; Hemoglobin 14.6; Magnesium 2.1; Platelets 271; Potassium 4.8; Sodium 141; TSH 2.46    Lipid Panel    Component Value Date/Time   CHOL 234 (H) 11/07/2019 1105   TRIG 61 11/07/2019 1105   HDL 67 11/07/2019 1105   CHOLHDL 3.5 11/07/2019 1105   VLDL 11 11/01/2016 1222   LDLCALC 151 (H) 11/07/2019 1105      Wt Readings from Last 3 Encounters:  12/23/19 188 lb (85.3 kg)  12/23/19 182 lb (82.6 kg)  11/07/19 189 lb (85.7 kg)      Other studies Reviewed: Additional studies/ records that were reviewed today include: Labs. Review of the above records demonstrates:  Please see elsewhere in the note.     ASSESSMENT AND PLAN:  TACHYCARDIA:    The patient is describing what almost definitely is a supraventricular tachycardia although I would like to capture this on an Al[facility] to confirm.  I am going to give him a pill in pocket as needed beta-blocker.  He is already had lab work done.  She has a very sick metabolic profile and these were normal.  We talked about vagal maneuvers.  If we cannot capture this he would call EMS if he is symptomatic or proceed to an urgent care or emergency room.  We talked about this.  His exam and baseline EKG are unremarkable I do not strongly suspect structural heart disease.  DYSLIPIDEMIA: His LDL was 151.  HDL was 67.  We talked about dietary changes.  I will defer to Larry Pinto, MD  COVID EDUCATION: He does not have the vaccine I answered some questions about this.  Current medicines are reviewed at length with the patient today.  The patient does not have concerns regarding medicines.  The following changes have been made:   As above  Labs/ tests ordered today include: None  Orders Placed This Encounter  Procedures  . EKG 12-Lead     Disposition:   FU with me in six months.     Signed, Minus Breeding, MD  12/23/2019 4:40 PM    [facility]

## 2019-12-23 ENCOUNTER — Encounter: Payer: Self-pay | Admitting: Cardiology

## 2019-12-23 ENCOUNTER — Other Ambulatory Visit: Payer: Self-pay

## 2019-12-23 ENCOUNTER — Ambulatory Visit (INDEPENDENT_AMBULATORY_CARE_PROVIDER_SITE_OTHER): Payer: Managed Care, Other (non HMO) | Admitting: Cardiology

## 2019-12-23 ENCOUNTER — Ambulatory Visit (AMBULATORY_SURGERY_CENTER): Payer: Managed Care, Other (non HMO) | Admitting: *Deleted

## 2019-12-23 VITALS — Ht 69.0 in | Wt 182.0 lb

## 2019-12-23 VITALS — BP 127/72 | HR 71 | Ht 69.0 in | Wt 188.0 lb

## 2019-12-23 DIAGNOSIS — Z01818 Encounter for other preprocedural examination: Secondary | ICD-10-CM

## 2019-12-23 DIAGNOSIS — R002 Palpitations: Secondary | ICD-10-CM

## 2019-12-23 DIAGNOSIS — Z1211 Encounter for screening for malignant neoplasm of colon: Secondary | ICD-10-CM

## 2019-12-23 MED ORDER — METOPROLOL TARTRATE 25 MG PO TABS
25.0000 mg | ORAL_TABLET | Freq: Two times a day (BID) | ORAL | 3 refills | Status: AC | PRN
Start: 2019-12-23 — End: 2020-03-22

## 2019-12-23 NOTE — Progress Notes (Signed)
Pt verified name, DOB, address and insurance during PV today. Pt mailed instruction packet to included paper to complete and mail back to [facility] with addressed and stamped envelope, Emmi video, copy of consent form to read and not return, and instructions. PV completed over the phone. Pt encouraged to call with questions or issues   No egg or soy allergy known to patient  No issues with past sedation with any surgeries  or procedures, no intubation problems  No diet pills per patient No home 02 use per patient  No blood thinners per patient  Pt denies issues with constipation  No A fib or A flutter  EMMI video sent to pt's e mail   Due to the COVID-19 pandemic we are asking patients to follow these guidelines. Please only bring one care partner. Please be aware that your care partner may wait in the car in the parking lot or if they feel like they will be too hot to wait in the car, they may wait in the lobby on the 4th floor. All care partners are required to wear a mask the entire time (we do not have any that we can provide them), they need to practice social distancing, and we will do a Covid check for all patient's and care partners when you arrive. Also we will check their temperature and your temperature. If the care partner waits in their car they need to stay in the parking lot the entire time and we will call them on their cell phone when the patient is ready for discharge so they can bring the car to the front of the building. Also all patient's will need to wear a mask into building.  Pt has OV today at 320 pm with Dr Percival Spanish  Cardiologist for a rapid heart rate- Explained to Larry Carter if Cardio orders ANY testing, that testing will have to be completed before he can have a colon in the [place]- pt verbalized understanding- Pt aware he may have to cancel 7-6 colon if cardiac testing is needed until cleared - pt to Call [place] nurse after OV- also in book in room 52 for PV nurse to follow up

## 2019-12-23 NOTE — Patient Instructions (Signed)
Medication Instructions:  START METOPROLOL TARTRATE 25MG  EVERY 12 HOURS AS NEEDED FOR RAPID HEART RATE *If you need a refill on your cardiac medications before your next appointment, please call your pharmacy*  Lab Work: NONE ORDERED THIS VISIT  Testing/Procedures: NONE ORDERED THIS VISIT  Follow-Up: At [facility], you and your health needs are our priority.  As part of our continuing mission to provide you with exceptional heart care, we have created designated Provider Care Teams.  These Care Teams include your primary Cardiologist (physician) and Advanced Practice Providers (APPs -  Physician Assistants and Nurse Practitioners) who all work together to provide you with the care you need, when you need it.  Your next appointment:   6 month(s)  You will receive a reminder letter in the mail two months in advance. If you don't receive a letter, please call our office to schedule the follow-up appointment.  The format for your next appointment:   In Person  Provider:   Minus Breeding, MD  Other Instructions [place]

## 2020-01-01 ENCOUNTER — Encounter: Payer: Self-pay | Admitting: Gastroenterology

## 2020-01-08 ENCOUNTER — Ambulatory Visit (INDEPENDENT_AMBULATORY_CARE_PROVIDER_SITE_OTHER): Payer: Managed Care, Other (non HMO)

## 2020-01-08 ENCOUNTER — Other Ambulatory Visit: Payer: Self-pay | Admitting: Gastroenterology

## 2020-01-08 DIAGNOSIS — Z1159 Encounter for screening for other viral diseases: Secondary | ICD-10-CM

## 2020-01-09 LAB — SARS CORONAVIRUS 2 (TAT 6-24 HRS): SARS Coronavirus 2: NEGATIVE

## 2020-01-13 ENCOUNTER — Other Ambulatory Visit: Payer: Self-pay

## 2020-01-13 ENCOUNTER — Encounter: Payer: Self-pay | Admitting: Gastroenterology

## 2020-01-13 ENCOUNTER — Ambulatory Visit (AMBULATORY_SURGERY_CENTER): Payer: Managed Care, Other (non HMO) | Admitting: Gastroenterology

## 2020-01-13 VITALS — BP 120/69 | HR 71 | Temp 97.7°F | Resp 15 | Ht 68.0 in | Wt 180.0 lb

## 2020-01-13 DIAGNOSIS — Z1211 Encounter for screening for malignant neoplasm of colon: Secondary | ICD-10-CM | POA: Diagnosis not present

## 2020-01-13 DIAGNOSIS — D124 Benign neoplasm of descending colon: Secondary | ICD-10-CM

## 2020-01-13 DIAGNOSIS — D125 Benign neoplasm of sigmoid colon: Secondary | ICD-10-CM

## 2020-01-13 MED ORDER — SODIUM CHLORIDE 0.9 % IV SOLN: 500.0000 mL | Freq: Once | INTRAVENOUS | Status: AC

## 2020-01-13 NOTE — Progress Notes (Signed)
Called to room to assist during endoscopic procedure.  Patient ID and intended procedure confirmed with present staff. Received instructions for my participation in the procedure from the performing physician.  

## 2020-01-13 NOTE — Progress Notes (Signed)
To PACU, VSS. Report to [email or web address] 

## 2020-01-13 NOTE — Op Note (Addendum)
[place] Patient Name: Larry Carter Procedure Date: 01/13/2020 10:04 AM MRN: [id number] Endoscopist: Mallie Mussel L. Loletha Carrow , MD Age: 54 Referring MD:  Date of Birth: 1966/01/20 Gender: Male Account #: [id number] Procedure:                Colonoscopy Indications:              Screening for colorectal malignant neoplasm, This                            is the patient's first colonoscopy Medicines:                Monitored Anesthesia Care Procedure:                Pre-Anesthesia Assessment:                           - Prior to the procedure, a History and Physical                            was performed, and patient medications and                            allergies were reviewed. The patient's tolerance of                            previous anesthesia was also reviewed. The risks                            and benefits of the procedure and the sedation                            options and risks were discussed with the patient.                            All questions were answered, and informed consent                            was obtained. Prior Anticoagulants: The patient has                            taken no previous anticoagulant or antiplatelet                            agents. ASA Grade Assessment: II - A patient with                            mild systemic disease. After reviewing the risks                            and benefits, the patient was deemed in                            satisfactory condition to undergo the procedure.  After obtaining informed consent, the colonoscope                            was passed under direct vision. Throughout the                            procedure, the patient's blood pressure, pulse, and                            oxygen saturations were monitored continuously. The                            Colonoscope was introduced through the anus and                            advanced to the the cecum,  identified by                            appendiceal orifice and ileocecal valve. The                            colonoscopy was performed without difficulty. The                            patient tolerated the procedure well. The quality                            of the bowel preparation was excellent. The                            ileocecal valve, appendiceal orifice, and rectum                            were photographed. The bowel preparation used was                            Miralax. Scope In: 10:09:12 AM Scope Out: 10:24:29 AM Scope Withdrawal Time: 0 hours 13 minutes 15 seconds  Total Procedure Duration: 0 hours 15 minutes 17 seconds  Findings:                 The perianal and digital rectal examinations were                            normal.                           Two sessile polyps were found in the sigmoid colon                            and descending colon. The polyps were 3 to 5 mm in                            size. These polyps were removed with a cold snare.  Resection and retrieval were complete.                           A few small-mouthed diverticula were found in the                            left colon.                           Internal hemorrhoids were found. The hemorrhoids                            were Grade I (internal hemorrhoids that do not                            prolapse).                           The exam was otherwise without abnormality on                            direct and retroflexion views. Complications:            No immediate complications. Estimated Blood Loss:     Estimated blood loss was minimal. Impression:               - Two 3 to 5 mm polyps in the sigmoid colon and in                            the descending colon, removed with a cold snare.                            Resected and retrieved.                           - Diverticulosis in the left colon.                           - Internal  hemorrhoids.                           - The examination was otherwise normal on direct                            and retroflexion views. Recommendation:           - Patient has a contact number available for                            emergencies. The signs and symptoms of potential                            delayed complications were discussed with the                            patient. Return to normal activities tomorrow.  Written discharge instructions were provided to the                            patient.                           - Resume previous diet.                           - Continue present medications.                           - Await pathology results.                           - Repeat colonoscopy is recommended for                            surveillance. The colonoscopy date will be                            determined after pathology results from today's                            exam become available for review. Malli Falotico L. Loletha Carrow, MD 01/13/2020 10:29:29 AM This report has been signed electronically.

## 2020-01-13 NOTE — Progress Notes (Signed)
Pt's states no medical or surgical changes since previsit or office visit. 

## 2020-01-13 NOTE — Patient Instructions (Signed)
YOU HAD AN ENDOSCOPIC PROCEDURE TODAY AT THE [facility]:   Refer to the procedure report that was given to you for any specific questions about what was found during the examination.  If the procedure report does not answer your questions, please call your gastroenterologist to clarify.  If you requested that your care partner not be given the details of your procedure findings, then the procedure report has been included in a sealed envelope for you to review at your convenience later.  YOU SHOULD EXPECT: Some feelings of bloating in the abdomen. Passage of more gas than usual.  Walking can help get rid of the air that was put into your GI tract during the procedure and reduce the bloating. If you had a lower endoscopy (such as a colonoscopy or flexible sigmoidoscopy) you may notice spotting of blood in your stool or on the toilet paper. If you underwent a bowel prep for your procedure, you may not have a normal bowel movement for a few days.  Please Note:  You might notice some irritation and congestion in your nose or some drainage.  This is from the oxygen used during your procedure.  There is no need for concern and it should clear up in a day or so.  SYMPTOMS TO REPORT IMMEDIATELY:   Following lower endoscopy (colonoscopy or flexible sigmoidoscopy):  Excessive amounts of blood in the stool  Significant tenderness or worsening of abdominal pains  Swelling of the abdomen that is new, acute  Fever of 100F or higher   For urgent or emergent issues, a gastroenterologist can be reached at any hour by calling [phone]. Do not use MyChart messaging for urgent concerns.    DIET:  We do recommend a small meal at first, but then you may proceed to your regular diet.  Drink plenty of fluids but you should avoid alcoholic beverages for 24 hours.  MEDICATIONS: Continue present medications.  Please see handouts given to you by your recovery nurse.  ACTIVITY:  You should plan to  take it easy for the rest of today and you should NOT DRIVE or use heavy machinery until tomorrow (because of the sedation medicines used during the test).    FOLLOW UP: Our staff will call the number listed on your records 48-72 hours following your procedure to check on you and address any questions or concerns that you may have regarding the information given to you following your procedure. If we do not reach you, we will leave a message.  We will attempt to reach you two times.  During this call, we will ask if you have developed any symptoms of COVID 19. If you develop any symptoms (ie: fever, flu-like symptoms, shortness of breath, cough etc.) before then, please call [phone].  If you test positive for Covid 19 in the 2 weeks post procedure, please call and report this information to us.    If any biopsies were taken you will be contacted by phone or by letter within the next 1-3 weeks.  Please call us at [phone] if you have not heard about the biopsies in 3 weeks.   Thank you for allowing us to provide for your healthcare needs today.   SIGNATURES/CONFIDENTIALITY: You and/or your care partner have signed paperwork which will be entered into your electronic medical record.  These signatures attest to the fact that that the information above on your After Visit Summary has been reviewed and is understood.  Full responsibility of the   confidentiality of this discharge information lies with you and/or your care-partner. 

## 2020-01-15 ENCOUNTER — Encounter: Payer: Self-pay | Admitting: Gastroenterology

## 2020-01-15 ENCOUNTER — Telehealth: Payer: Self-pay

## 2020-01-15 NOTE — Telephone Encounter (Signed)
°  Follow up Call-  Call back number 01/13/2020  Post procedure Call Back phone  # [phone]  Permission to leave phone message Yes  Some recent data might be hidden     Patient questions:  Do you have a fever, pain , or abdominal swelling? No. Pain Score  0 *  Have you tolerated food without any problems? Yes.    Have you been able to return to your normal activities? Yes.    Do you have any questions about your discharge instructions: Diet   No. Medications  No. Follow up visit  No.  Do you have questions or concerns about your Care? No.  Actions: * If pain score is 4 or above: No action needed, pain <4.   1. Have you developed a fever since your procedure? no  2.   Have you had an respiratory symptoms (SOB or cough) since your procedure? no  3.   Have you tested positive for COVID 19 since your procedure no  4.   Have you had any family members/close contacts diagnosed with the COVID 19 since your procedure?  no   If yes to any of these questions please route to Joylene John, RN and Erenest Rasher, RN

## 2020-07-26 DIAGNOSIS — J069 Acute upper respiratory infection, unspecified: Secondary | ICD-10-CM | POA: Diagnosis not present

## 2020-07-26 DIAGNOSIS — Z20822 Contact with and (suspected) exposure to covid-19: Secondary | ICD-10-CM | POA: Diagnosis not present

## 2020-07-26 DIAGNOSIS — J029 Acute pharyngitis, unspecified: Secondary | ICD-10-CM | POA: Diagnosis not present

## 2020-07-26 DIAGNOSIS — R059 Cough, unspecified: Secondary | ICD-10-CM | POA: Diagnosis not present

## 2020-07-26 DIAGNOSIS — Z03818 Encounter for observation for suspected exposure to other biological agents ruled out: Secondary | ICD-10-CM | POA: Diagnosis not present

## 2020-07-29 DIAGNOSIS — Z20822 Contact with and (suspected) exposure to covid-19: Secondary | ICD-10-CM | POA: Diagnosis not present

## 2020-10-22 DIAGNOSIS — S61219A Laceration without foreign body of unspecified finger without damage to nail, initial encounter: Secondary | ICD-10-CM | POA: Diagnosis not present

## 2020-10-22 DIAGNOSIS — M79631 Pain in right forearm: Secondary | ICD-10-CM | POA: Diagnosis not present

## 2022-02-17 ENCOUNTER — Ambulatory Visit: Payer: Self-pay | Admitting: Nurse Practitioner

## 2022-02-17 ENCOUNTER — Ambulatory Visit: Payer: BC Managed Care – PPO | Admitting: Nurse Practitioner

## 2022-02-17 VITALS — BP 122/82 | HR 72 | Temp 97.7°F | Ht 69.0 in | Wt 206.0 lb

## 2022-02-17 DIAGNOSIS — R5383 Other fatigue: Secondary | ICD-10-CM | POA: Diagnosis not present

## 2022-02-17 DIAGNOSIS — Z13220 Encounter for screening for lipoid disorders: Secondary | ICD-10-CM | POA: Diagnosis not present

## 2022-02-17 DIAGNOSIS — R6882 Decreased libido: Secondary | ICD-10-CM

## 2022-02-17 DIAGNOSIS — N521 Erectile dysfunction due to diseases classified elsewhere: Secondary | ICD-10-CM

## 2022-02-17 DIAGNOSIS — E559 Vitamin D deficiency, unspecified: Secondary | ICD-10-CM

## 2022-02-17 MED ORDER — SILDENAFIL CITRATE 50 MG PO TABS: 50.0000 mg | ORAL_TABLET | ORAL | 1 refills | Status: DC | PRN

## 2022-02-17 NOTE — Patient Instructions (Signed)
Consider taking Zinc 50 mg daily for ED improvement.  Zinc Acetate Capsules What is this medication? ZINC ACETATE (zingk AS a tate) treats Wilson disease, a condition that causes high levels of copper in your body. It works by reducing the amount of copper your body absorbs from food. This medicine may be used for other purposes; ask your health care provider or pharmacist if you have questions. COMMON BRAND NAME(S): Galzin, Zincate What should I tell my care team before I take this medication? They need to know if you have any of these conditions: An unusual or allergic reaction to zinc, other medications, foods, dyes, or preservatives Pregnant or trying to get pregnant Breast-feeding How should I use this medication? Take this medication by mouth with water. Take it as directed on the prescription label at the same time every day. Do not cut, crush or chew this medication. Swallow the capsules whole. Take this medication on an empty stomach, at least 1 hour before or 2 to 3 hours after food. Keep taking it unless your care team tells you to stop. Talk to your care team about the use of this medication in children. While it may be given to children for selected conditions, precautions do apply. Overdosage: If you think you have taken too much of this medicine contact a poison control center or emergency room at once. NOTE: This medicine is only for you. Do not share this medicine with others. What if I miss a dose? If you miss a dose, take it as soon as you can. If it is almost time for your next dose, take only that dose. Do not take double or extra doses. What may interact with this medication? Baloxavir Edetate calcium, EDTA Eltrombopag Iron supplements Quinolone antibiotics, such as ciprofloxacin, delafloxacin, levofloxacin, moxifloxacin, norfloxacin, ofloxacin Tetracycline antibiotics, such as doxycycline, minocycline, tetracycline This list may not describe all possible interactions.  Give your health care provider a list of all the medicines, herbs, non-prescription drugs, or dietary supplements you use. Also tell them if you smoke, drink alcohol, or use illegal drugs. Some items may interact with your medicine. What should I watch for while using this medication? Visit your care team for regular checks on your progress. Tell your care team if your symptoms do not start to get better or if they get worse. You may need blood work while you are taking this medication. What side effects may I notice from receiving this medication? Side effects that you should report to your care team as soon as possible: Allergic reactions--skin rash, itching, hives, swelling of the face, lips, tongue, or throat Side effects that usually do not require medical attention (report to your care team if they continue or are bothersome): Heartburn Nausea Upset stomach Vomiting This list may not describe all possible side effects. Call your doctor for medical advice about side effects. You may report side effects to FDA at 1-800-FDA-1088. Where should I keep my medication? Keep out of the reach of children and pets. Store at room temperature between 20 and 25 degrees C (68 and 77 degrees F). Get rid of any unused medication after the expiration date. To get rid of medications that are no longer needed or have expired: Take the medication to a medication take-back program. Check with your pharmacy or law enforcement to find a location. If you cannot return the medication, check the label or package insert to see if the medication should be thrown out in the garbage or flushed down the  toilet. If you are not sure, ask your care team. If it is safe to put it in the trash, pour the medication out of the container. Mix the medication with cat litter, dirt, coffee grounds, or other unwanted substance. Seal the mixture in a bag or container. Put it in the trash. NOTE: This sheet is a summary. It may not cover  all possible information. If you have questions about this medicine, talk to your doctor, pharmacist, or health care provider.  2023 Elsevier/Gold Standard (2006-01-03 00:00:00)

## 2022-02-17 NOTE — Progress Notes (Signed)
Assessment and Plan:  Javin Nong was seen today for a follow up.  Diagnoses and all order for this visit:  1. Low libido  - CBC with Differential/Platelet - COMPLETE METABOLIC PANEL WITH GFR - Lipid panel  2. Erectile dysfunction due to diseases classified elsewhere Start Sildenafil as directed. Start Zinc supplement daily Report to ER for erection lasting >4 hr. Medication discussed and reviewed in full. All questions and concerns regarding medications addressed.     3. Other fatigue Defer testosterone check today. Continue to monitor  4. Screening for cholesterol level  - Lipid panel  5. Vitamin D deficiency  - VITAMIN D 25 Hydroxy (Vit-D Deficiency, Fractures)  Notify office for further evaluation and treatment, questions or concerns if s/s fail to improve. The risks and benefits of my recommendations, as well as other treatment options were discussed with the patient today. Questions were answered.  Further disposition pending results of labs. Discussed med's effects and SE's.    Over 20 minutes of exam, counseling, chart review, and critical decision making was performed.   No future appointments.  ------------------------------------------------------------------------------------------------------------------   HPI BP 122/82   Pulse 72   Temp 97.7 F (36.5 C)   Ht '5\' 9"'$  (1.753 m)   Wt 206 lb (93.4 kg)   SpO2 98%   BMI 30.42 kg/m    Kylian Loh is a 56 y.o. male here for evaluation and treatment of erectile dysfunction, low libido and fatigue. Onset of dysfunction was a few months ago and onset was gradual. Patient states he has difficulty both attaining and maintaining erection. Full erections occur with intercourse. Partial erections occur with intercourse. Libido is affected. Risk factors for ED include  none . Patient denies history of antihypertensive medications:  , beta blocker use, cardiovascular disease:  , cranial, spinal, or pelvic  trauma, diabetes mellitus, hypogonadism, neurologic disease:  , pelvic radiation, and urologic disease: OSA, snoring . He has a hx of elevated cholesterol. Patient's expectations of sexual function is 2-3 x weekly. Patient describes relationship with partner as attracted to wife and healthy, maintained. Previous treatment of ED includes none.    Past Medical History:  Diagnosis Date   Allergic rhinitis    Allergy    Arthritis    Rapid heart beat    Urticaria    "itchy skin that swells when I scratch it" x years, random--referred to allergist 04/2014 and pt no-showed.     Allergies  Allergen Reactions   Aspirin Rash   Penicillins Swelling    Current Outpatient Medications on File Prior to Visit  Medication Sig   metoprolol tartrate (LOPRESSOR) 25 MG tablet Take 1 tablet (25 mg total) by mouth every 12 (twelve) hours as needed (RAPID HEART RATE).   Current Facility-Administered Medications on File Prior to Visit  Medication   0.9 %  sodium chloride infusion    ROS: all negative except what is noted in the HPI.   Physical Exam:  BP 122/82   Pulse 72   Temp 97.7 F (36.5 C)   Ht '5\' 9"'$  (1.753 m)   Wt 206 lb (93.4 kg)   SpO2 98%   BMI 30.42 kg/m   General Appearance: NAD.  Awake, conversant and cooperative. Eyes: PERRLA, EOMs intact.  Sclera white.  Conjunctiva without erythema. Sinuses: No frontal/maxillary tenderness.  No nasal discharge. Nares patent.  ENT/Mouth: Ext aud canals clear.  Bilateral TMs w/DOL and without erythema or bulging. Hearing intact.  Posterior pharynx without swelling or exudate.  Tonsils  without swelling or erythema.  Neck: Supple.  No masses, nodules or thyromegaly. Respiratory: Effort is regular with non-labored breathing. Breath sounds are equal bilaterally without rales, rhonchi, wheezing or stridor.  Cardio: RRR with no MRGs. Brisk peripheral pulses without edema.  Abdomen: Active BS in all four quadrants.  Soft and non-tender without guarding,  rebound tenderness, hernias or masses. Lymphatics: Non tender without lymphadenopathy.  Musculoskeletal: Full ROM, 5/5 strength, normal ambulation.  No clubbing or cyanosis. Skin: Appropriate color for ethnicity. Warm without rashes, lesions, ecchymosis, ulcers.  Neuro: CN II-XII grossly normal. Normal muscle tone without cerebellar symptoms and intact sensation.   Psych: AO X 3,  appropriate mood and affect, insight and judgment.     Darrol Jump, NP 10:02 AM  [facility]

## 2022-02-18 LAB — CBC WITH DIFFERENTIAL/PLATELET
Absolute Monocytes: 435 cells/uL (ref 200–950)
Basophils Absolute: 30 cells/uL (ref 0–200)
Basophils Relative: 0.6 %
Eosinophils Absolute: 110 cells/uL (ref 15–500)
Eosinophils Relative: 2.2 %
HCT: 45 % (ref 38.5–50.0)
Hemoglobin: 15.2 g/dL (ref 13.2–17.1)
Lymphs Abs: 1135 cells/uL (ref 850–3900)
MCH: 29.4 pg (ref 27.0–33.0)
MCHC: 33.8 g/dL (ref 32.0–36.0)
MCV: 87 fL (ref 80.0–100.0)
MPV: 9.8 fL (ref 7.5–12.5)
Monocytes Relative: 8.7 %
Neutro Abs: 3290 cells/uL (ref 1500–7800)
Neutrophils Relative %: 65.8 %
Platelets: 278 10*3/uL (ref 140–400)
RBC: 5.17 10*6/uL (ref 4.20–5.80)
RDW: 13 % (ref 11.0–15.0)
Total Lymphocyte: 22.7 %
WBC: 5 10*3/uL (ref 3.8–10.8)

## 2022-02-18 LAB — COMPLETE METABOLIC PANEL WITH GFR
AG Ratio: 2 (calc) (ref 1.0–2.5)
ALT: 14 U/L (ref 9–46)
AST: 15 U/L (ref 10–35)
Albumin: 4.4 g/dL (ref 3.6–5.1)
Alkaline phosphatase (APISO): 69 U/L (ref 35–144)
BUN: 17 mg/dL (ref 7–25)
CO2: 27 mmol/L (ref 20–32)
Calcium: 9.3 mg/dL (ref 8.6–10.3)
Chloride: 108 mmol/L (ref 98–110)
Creat: 1.23 mg/dL (ref 0.70–1.30)
Globulin: 2.2 g/dL (calc) (ref 1.9–3.7)
Glucose, Bld: 95 mg/dL (ref 65–99)
Potassium: 4.6 mmol/L (ref 3.5–5.3)
Sodium: 140 mmol/L (ref 135–146)
Total Bilirubin: 0.7 mg/dL (ref 0.2–1.2)
Total Protein: 6.6 g/dL (ref 6.1–8.1)
eGFR: 70 mL/min/{1.73_m2} (ref >=60)

## 2022-02-18 LAB — VITAMIN D 25 HYDROXY (VIT D DEFICIENCY, FRACTURES): Vit D, 25-Hydroxy: 30 ng/mL (ref 30–100)

## 2022-02-18 LAB — LIPID PANEL
Cholesterol: 236 mg/dL — ABNORMAL HIGH (ref <200)
HDL: 60 mg/dL (ref >=40)
LDL Cholesterol (Calc): 156 mg/dL (calc) — ABNORMAL HIGH
Non-HDL Cholesterol (Calc): 176 mg/dL (calc) — ABNORMAL HIGH (ref <130)
Total CHOL/HDL Ratio: 3.9 (calc) (ref <5.0)
Triglycerides: 91 mg/dL (ref <150)

## 2022-02-20 ENCOUNTER — Encounter: Payer: Self-pay | Admitting: Nurse Practitioner

## 2022-02-20 ENCOUNTER — Other Ambulatory Visit: Payer: Self-pay

## 2022-02-20 DIAGNOSIS — N521 Erectile dysfunction due to diseases classified elsewhere: Secondary | ICD-10-CM

## 2022-02-20 MED ORDER — SILDENAFIL CITRATE 50 MG PO TABS: 50.0000 mg | ORAL_TABLET | ORAL | 1 refills | Status: AC | PRN

## 2022-03-24 ENCOUNTER — Ambulatory Visit: Payer: BC Managed Care – PPO | Admitting: Nurse Practitioner

## 2022-03-24 NOTE — Progress Notes (Deleted)
Assessment and Plan:  Larry Carter was seen today for a follow up.  Diagnoses and all order for this visit:  1. Low libido  - CBC with Differential/Platelet - COMPLETE METABOLIC PANEL WITH GFR - Lipid panel  2. Erectile dysfunction due to diseases classified elsewhere Start Sildenafil as directed. Start Zinc supplement daily Report to ER for erection lasting >4 hr. Medication discussed and reviewed in full. All questions and concerns regarding medications addressed.     3. Other fatigue Defer testosterone check today. Continue to monitor  4. Screening for cholesterol level  - Lipid panel  5. Vitamin D deficiency  - VITAMIN D 25 Hydroxy (Vit-D Deficiency, Fractures)  Notify office for further evaluation and treatment, questions or concerns if s/s fail to improve. The risks and benefits of my recommendations, as well as other treatment options were discussed with the patient today. Questions were answered.  Further disposition pending results of labs. Discussed med's effects and SE's.    Over 20 minutes of exam, counseling, chart review, and critical decision making was performed.   Future Appointments  Date Time Provider [place]  03/24/2022  9:30 AM Darrol Jump, NP [facility] None    ------------------------------------------------------------------------------------------------------------------   HPI There were no vitals taken for this visit.   Larry Carter is a 56 y.o. male here for evaluation and treatment of erectile dysfunction, low libido and fatigue. Onset of dysfunction was a few months ago and onset was gradual. Patient states he has difficulty both attaining and maintaining erection. Full erections occur with intercourse. Partial erections occur with intercourse. Libido is affected. Risk factors for ED include  none . Patient denies history of antihypertensive medications:  , beta blocker use, cardiovascular disease:  , cranial, spinal, or  pelvic trauma, diabetes mellitus, hypogonadism, neurologic disease:  , pelvic radiation, and urologic disease: OSA, snoring . He has a hx of elevated cholesterol. Patient's expectations of sexual function is 2-3 x weekly. Patient describes relationship with partner as attracted to wife and healthy, maintained. Previous treatment of ED includes none.    Past Medical History:  Diagnosis Date   Allergic rhinitis    Allergy    Arthritis    Rapid heart beat    Urticaria    "itchy skin that swells when I scratch it" x years, random--referred to allergist 04/2014 and pt no-showed.     Allergies  Allergen Reactions   Aspirin Rash   Penicillins Swelling    Current Outpatient Medications on File Prior to Visit  Medication Sig   metoprolol tartrate (LOPRESSOR) 25 MG tablet Take 1 tablet (25 mg total) by mouth every 12 (twelve) hours as needed (RAPID HEART RATE).   sildenafil (VIAGRA) 50 MG tablet Take 1 tablet (50 mg total) by mouth as needed for erectile dysfunction.   Current Facility-Administered Medications on File Prior to Visit  Medication   0.9 %  sodium chloride infusion    ROS: all negative except what is noted in the HPI.   Physical Exam:  There were no vitals taken for this visit.  General Appearance: NAD.  Awake, conversant and cooperative. Eyes: PERRLA, EOMs intact.  Sclera white.  Conjunctiva without erythema. Sinuses: No frontal/maxillary tenderness.  No nasal discharge. Nares patent.  ENT/Mouth: Ext aud canals clear.  Bilateral TMs w/DOL and without erythema or bulging. Hearing intact.  Posterior pharynx without swelling or exudate.  Tonsils without swelling or erythema.  Neck: Supple.  No masses, nodules or thyromegaly. Respiratory: Effort is regular with non-labored breathing. Breath  sounds are equal bilaterally without rales, rhonchi, wheezing or stridor.  Cardio: RRR with no MRGs. Brisk peripheral pulses without edema.  Abdomen: Active BS in all four quadrants.   Soft and non-tender without guarding, rebound tenderness, hernias or masses. Lymphatics: Non tender without lymphadenopathy.  Musculoskeletal: Full ROM, 5/5 strength, normal ambulation.  No clubbing or cyanosis. Skin: Appropriate color for ethnicity. Warm without rashes, lesions, ecchymosis, ulcers.  Neuro: CN II-XII grossly normal. Normal muscle tone without cerebellar symptoms and intact sensation.   Psych: AO X 3,  appropriate mood and affect, insight and judgment.     Darrol Jump, NP 8:51 AM [facility]

## 2024-01-14 ENCOUNTER — Ambulatory Visit
Admission: RE | Admit: 2024-01-14 | Discharge: 2024-01-14 | Disposition: A | Discharge status: Home / Self Care | Source: Ambulatory Visit | Attending: Nurse Practitioner

## 2024-01-14 ENCOUNTER — Other Ambulatory Visit: Payer: Self-pay | Admitting: Nurse Practitioner

## 2024-01-14 DIAGNOSIS — Z021 Encounter for pre-employment examination: Secondary | ICD-10-CM

## 2024-06-29 DIAGNOSIS — M5441 Lumbago with sciatica, right side: Secondary | ICD-10-CM | POA: Diagnosis not present
